# Patient Record
Sex: Female | Born: 1950 | Race: Black or African American | Hispanic: No | Smoking: Never smoker
Health system: Southern US, Community
[De-identification: ages and names within clinical notes are randomized; demographics above are authoritative.]

## PROBLEM LIST (undated history)

## (undated) DIAGNOSIS — E785 Hyperlipidemia, unspecified: Secondary | ICD-10-CM

## (undated) DIAGNOSIS — E119 Type 2 diabetes mellitus without complications: Secondary | ICD-10-CM

## (undated) DIAGNOSIS — D649 Anemia, unspecified: Secondary | ICD-10-CM

## (undated) DIAGNOSIS — T7840XA Allergy, unspecified, initial encounter: Secondary | ICD-10-CM

## (undated) DIAGNOSIS — J45909 Unspecified asthma, uncomplicated: Secondary | ICD-10-CM

## (undated) DIAGNOSIS — I10 Essential (primary) hypertension: Secondary | ICD-10-CM

## (undated) DIAGNOSIS — C50919 Malignant neoplasm of unspecified site of unspecified female breast: Secondary | ICD-10-CM

## (undated) DIAGNOSIS — C801 Malignant (primary) neoplasm, unspecified: Secondary | ICD-10-CM

## (undated) HISTORY — DX: Hyperlipidemia, unspecified: E78.5

## (undated) HISTORY — DX: Anemia, unspecified: D64.9

## (undated) HISTORY — PX: MASTECTOMY: SHX3

## (undated) HISTORY — DX: Essential (primary) hypertension: I10

## (undated) HISTORY — DX: Type 2 diabetes mellitus without complications: E11.9

## (undated) HISTORY — PX: BREAST SURGERY: SHX581

## (undated) HISTORY — DX: Malignant (primary) neoplasm, unspecified: C80.1

## (undated) HISTORY — DX: Allergy, unspecified, initial encounter: T78.40XA

## (undated) HISTORY — DX: Unspecified asthma, uncomplicated: J45.909

## (undated) HISTORY — PX: ABDOMINAL HYSTERECTOMY: SHX81

---

## 1994-07-29 DIAGNOSIS — C50919 Malignant neoplasm of unspecified site of unspecified female breast: Secondary | ICD-10-CM

## 1994-07-29 HISTORY — DX: Malignant neoplasm of unspecified site of unspecified female breast: C50.919

## 1998-06-21 ENCOUNTER — Ambulatory Visit: Admission: RE | Admit: 1998-06-21 | Discharge: 1998-06-21 | Payer: Self-pay | Admitting: Obstetrics & Gynecology

## 1998-07-26 ENCOUNTER — Inpatient Hospital Stay (HOSPITAL_COMMUNITY): Admission: AD | Admit: 1998-07-26 | Discharge: 1998-07-26 | Payer: Self-pay | Admitting: Obstetrics and Gynecology

## 1998-08-21 ENCOUNTER — Inpatient Hospital Stay (HOSPITAL_COMMUNITY): Admission: AD | Admit: 1998-08-21 | Discharge: 1998-08-24 | Payer: Self-pay | Admitting: Obstetrics & Gynecology

## 1998-10-03 ENCOUNTER — Ambulatory Visit (HOSPITAL_COMMUNITY): Admission: RE | Admit: 1998-10-03 | Discharge: 1998-10-03 | Payer: Self-pay | Admitting: Family Medicine

## 1998-10-03 ENCOUNTER — Encounter: Payer: Self-pay | Admitting: Family Medicine

## 2002-10-07 ENCOUNTER — Encounter: Admission: RE | Admit: 2002-10-07 | Discharge: 2002-10-07 | Payer: Self-pay | Admitting: Family Medicine

## 2002-10-07 ENCOUNTER — Encounter: Payer: Self-pay | Admitting: Family Medicine

## 2004-04-04 ENCOUNTER — Encounter: Admission: RE | Admit: 2004-04-04 | Discharge: 2004-04-04 | Payer: Self-pay | Admitting: Family Medicine

## 2004-05-21 ENCOUNTER — Ambulatory Visit (HOSPITAL_COMMUNITY): Admission: RE | Admit: 2004-05-21 | Discharge: 2004-05-21 | Payer: Self-pay | Admitting: Gastroenterology

## 2004-07-18 ENCOUNTER — Encounter: Admission: RE | Admit: 2004-07-18 | Discharge: 2004-10-16 | Payer: Self-pay | Admitting: Family Medicine

## 2004-11-14 ENCOUNTER — Encounter: Admission: RE | Admit: 2004-11-14 | Discharge: 2005-02-12 | Payer: Self-pay | Admitting: Family Medicine

## 2008-11-02 ENCOUNTER — Emergency Department (HOSPITAL_COMMUNITY): Admission: EM | Admit: 2008-11-02 | Discharge: 2008-11-02 | Payer: Self-pay | Admitting: Emergency Medicine

## 2009-01-26 ENCOUNTER — Ambulatory Visit (HOSPITAL_BASED_OUTPATIENT_CLINIC_OR_DEPARTMENT_OTHER): Admission: RE | Admit: 2009-01-26 | Discharge: 2009-01-27 | Payer: Self-pay | Admitting: Orthopedic Surgery

## 2009-08-26 ENCOUNTER — Emergency Department (HOSPITAL_COMMUNITY): Admission: EM | Admit: 2009-08-26 | Discharge: 2009-08-26 | Payer: Self-pay | Admitting: Emergency Medicine

## 2010-10-15 LAB — CBC
HCT: 36.2 % (ref 36.0–46.0)
Hemoglobin: 11.5 g/dL — ABNORMAL LOW (ref 12.0–15.0)
MCHC: 31.7 g/dL (ref 30.0–36.0)
MCV: 91 fL (ref 78.0–100.0)
Platelets: 173 10*3/uL (ref 150–400)
RBC: 3.98 MIL/uL (ref 3.87–5.11)
RDW: 15 % (ref 11.5–15.5)
WBC: 5.7 10*3/uL (ref 4.0–10.5)

## 2010-10-15 LAB — POCT I-STAT, CHEM 8
BUN: 20 mg/dL (ref 6–23)
Calcium, Ion: 1.25 mmol/L (ref 1.12–1.32)
Chloride: 108 mEq/L (ref 96–112)
Creatinine, Ser: 0.7 mg/dL (ref 0.4–1.2)
Glucose, Bld: 124 mg/dL — ABNORMAL HIGH (ref 70–99)
HCT: 38 % (ref 36.0–46.0)
Hemoglobin: 12.9 g/dL (ref 12.0–15.0)
Potassium: 3.7 mEq/L (ref 3.5–5.1)
Sodium: 141 mEq/L (ref 135–145)
TCO2: 27 mmol/L (ref 0–100)

## 2010-10-15 LAB — COMPREHENSIVE METABOLIC PANEL
ALT: 49 U/L — ABNORMAL HIGH (ref 0–35)
AST: 53 U/L — ABNORMAL HIGH (ref 0–37)
Albumin: 3.7 g/dL (ref 3.5–5.2)
Alkaline Phosphatase: 37 U/L — ABNORMAL LOW (ref 39–117)
BUN: 19 mg/dL (ref 6–23)
CO2: 25 mEq/L (ref 19–32)
Calcium: 9.3 mg/dL (ref 8.4–10.5)
Chloride: 105 mEq/L (ref 96–112)
Creatinine, Ser: 0.61 mg/dL (ref 0.4–1.2)
GFR calc Af Amer: >60 mL/min (ref >60)
GFR calc non Af Amer: >60 mL/min (ref >60)
Glucose, Bld: 126 mg/dL — ABNORMAL HIGH (ref 70–99)
Potassium: 3.6 mEq/L (ref 3.5–5.1)
Sodium: 137 mEq/L (ref 135–145)
Total Bilirubin: 0.6 mg/dL (ref 0.3–1.2)
Total Protein: 7.2 g/dL (ref 6.0–8.3)

## 2010-10-15 LAB — DIFFERENTIAL
Basophils Absolute: 0 10*3/uL (ref 0.0–0.1)
Basophils Relative: 1 % (ref 0–1)
Eosinophils Absolute: 0.1 10*3/uL (ref 0.0–0.7)
Eosinophils Relative: 1 % (ref 0–5)
Lymphocytes Relative: 27 % (ref 12–46)
Lymphs Abs: 1.5 10*3/uL (ref 0.7–4.0)
Monocytes Absolute: 0.3 10*3/uL (ref 0.1–1.0)
Monocytes Relative: 6 % (ref 3–12)
Neutro Abs: 3.7 10*3/uL (ref 1.7–7.7)
Neutrophils Relative %: 65 % (ref 43–77)

## 2010-11-04 LAB — POCT HEMOGLOBIN-HEMACUE: Hemoglobin: 12.4 g/dL (ref 12.0–15.0)

## 2010-11-04 LAB — GLUCOSE, CAPILLARY
Glucose-Capillary: 156 mg/dL — ABNORMAL HIGH (ref 70–99)
Glucose-Capillary: 86 mg/dL (ref 70–99)

## 2010-11-05 LAB — BASIC METABOLIC PANEL
BUN: 16 mg/dL (ref 6–23)
CO2: 28 mEq/L (ref 19–32)
Calcium: 9.3 mg/dL (ref 8.4–10.5)
Chloride: 107 mEq/L (ref 96–112)
Creatinine, Ser: 0.66 mg/dL (ref 0.4–1.2)
GFR calc Af Amer: >60 mL/min (ref >60)
GFR calc non Af Amer: >60 mL/min (ref >60)
Glucose, Bld: 105 mg/dL — ABNORMAL HIGH (ref 70–99)
Potassium: 3.6 mEq/L (ref 3.5–5.1)
Sodium: 140 mEq/L (ref 135–145)

## 2010-11-07 LAB — GLUCOSE, CAPILLARY: Glucose-Capillary: 146 mg/dL — ABNORMAL HIGH (ref 70–99)

## 2010-12-11 NOTE — Op Note (Signed)
NAMEANASTAZJA, ISAAC NO.:  [id number]   MEDICAL RECORD NO.:  [id number]          PATIENT TYPE:  AMB   LOCATION:  DSC                          FACILITY:  [facility]   PHYSICIAN:  Loreta Ave, M.D. DATE OF BIRTH:  1951/07/01   DATE OF PROCEDURE:  01/25/2009  DATE OF DISCHARGE:  01/26/2009                               OPERATIVE REPORT   PREOPERATIVE DIAGNOSES:  Right knee nonunion vertical fracture, patella  lateral aspect; lateral meniscus tear; chondromalacia, patella.   POSTOPERATIVE DIAGNOSIS:  Right knee nonunion vertical fracture, patella  lateral aspect; lateral meniscus tear; chondromalacia, patella with  relatively extensive grade 3 changes, patellofemoral joint.   PROCEDURES:  Right knee exam under anesthesia, arthroscopy, debridement  of the radial tearing throughout lateral meniscus; chondroplasty,  patella; open reduction and internal fixation of nonunion patellar  fracture with 2 cannulated 4.0 screws; internal lateral retinacular  release.   SURGEON:  Loreta Ave, MD   ASSISTANT:  Genene Churn. Barry Dienes, PA   ANESTHESIA:  General.   ESTIMATED BLOOD LOSS:  Minimal.   SPECIMENS:  None.   CULTURES:  None.   COMPLICATIONS:  None.   DRESSING:  Sterile compressive with knee immobilizer.   TOURNIQUET TIME:  1 hour.   PROCEDURE:  The patient brought to the operating room and placed on the  operating table in supine position.  After adequate anesthesia had been  obtained, knee examined.  Full motion including hyperextension.  Patellofemoral crepitus.  Some lateral tracking, not extreme and some  tethering, not extreme.  Tourniquet applied.  Prepped and draped in the  usual sterile fashion.  Exsanguinated with elevation and Esmarch,  tourniquet inflated to 350 mmHg.  Anteromedial, anterolateral  arthroscopic portals.  Arthroscope introduced and the knee inspected.  Some lateral tracking and tethering, not extreme but present.  The  nonunion was  obvious with mobility and involved about 25% of the  patella, lateral aspect.  Remaining patella had diffuse grade 3 changes  debrided.  Grade 2 on the trochlea debrided.  Medial meniscus, medial  compartment, lateral meniscus, and lateral compartment had some mild  degenerative changes, nothing extreme.  Cruciate ligament was intact.  Radial tearing throughout the lateral meniscus debrided back to healthy  tissue.  Once that was confirmed, instruments and fluid removed.  Lateral incision over the patella.  Skin and subcutaneous tissue  divided.  Patella and nonunion exposed.  This was curetted back to good  healthy tissue, bleeding bone on both sides.  With fluoroscopic  guidance, I then fixed the fracture with 2 guidewires, pre-drilled, and  then countersunk 4.0 screws placed from lateral to medial.  Gave  excellent capturing, fixation, and stability.  Confirmed visually as  well as fluoroscopically.  Arthroscope reintroduced, confirmed good  fixation of fracture.  She was tending to tether more once that fracture  had healed.  I cleaned out all the adhesions, synovial tissue, and the  undersurface of the lateral retinaculum, especially throughout the  midportion.  Once that was complete, I looked good tracking and I did  not feel that a further release  of the retinaculum with cautery was  indicated, as the degree of tethering was sufficiently obliterated with  internal release.  The entire knee was examined, no other findings.  Instruments and fluid removed.  Portals closed with nylon.  The fascia  overlying the fracture was then closed with Vicryl.  Skin and  subcutaneous tissue with Vicryl.  Steri-Strips applied.  Sterile  compressive dressing applied.  Tourniquet deflated and removed.  Knee  immobilizer applied.  Anesthesia reversed.  Brought to the recovery  room.  Tolerated the surgery well.  No complications.      Loreta Ave, M.D.  Electronically Signed     DFM/MEDQ   D:  01/26/2009  T:  01/27/2009  Job:  [id number]

## 2010-12-14 NOTE — Op Note (Signed)
NAME:  Mary Choi, Mary Choi NO.:  [id number]   MEDICAL RECORD NO.:  [id number]          PATIENT TYPE:  AMB   LOCATION:  ENDO                         FACILITY:  [facility]   PHYSICIAN:  Anselmo Rod, M.D.  DATE OF BIRTH:  Jun 19, 1951   DATE OF PROCEDURE:  05/21/2004  DATE OF DISCHARGE:                                 OPERATIVE REPORT   PROCEDURE:  Screening colonoscopy.   ENDOSCOPIST:  Anselmo Rod, M.D.   INSTRUMENT USED:  Olympus video colonoscope.   INDICATIONS FOR PROCEDURE:  This 60 year old African/American female with a  personal history of breast cancer diagnosed and treated in 1996, in the  [facility], undergoing a screening colonoscopy to rule out colon polyps, masses,  etc.   PRE-PROCEDURE PREPARATION:  An informed consent was procured from the  patient.  The patient was fasted for eight hours prior to the procedure and  prepped with a bottle of magnesium citrate and one gallon of GoLYTELY on the  night prior to the procedure.   PRE-PROCEDURE PHYSICAL EXAMINATION:  VITAL SIGNS:  Stable.  NECK:  Supple.  CHEST:  Clear to auscultation.  HEART:  S1, S2 regular.  ABDOMEN:  Soft, normal bowel sounds.   DESCRIPTION OF PROCEDURE:  The patient was placed in the left lateral  decubitus position and sedated with 75 mg of Demerol and 10 mg of Versed in  slow incremental doses.  Once the patient was adequately sedated, and  maintained on low-flow oxygen and continuous cardiac monitoring, the Olympus  video colonoscope was advanced from the rectum to the cecum with extreme  difficulty.  The patient had a very tortous colon.  The position was changed  from the left lateral to the supine, and the right lateral position on a  couple of occasions with gentle application of abdominal pressure.  She was  also turned in the prone position to reach the cecum.  The appendicular  orifice and the ileocecal valve were clearly visualized and photographed.  No masses, polyps,  erosions, ulcerations or diverticula were seen.  Retroflexion in the rectum revealed small internal hemorrhoids.  The patient tolerated the procedure well without immediate complications.   IMPRESSION:  1.  Very tortous colon.  No masses, polyps, or diverticula seen.  2.  Small internal hemorrhoids appreciated on retroflexion.   RECOMMENDATIONS:  1.  Repeat colonoscopy has been recommended in the next five years, unless      the patient develops any abnormal      symptoms in the interim.  2.  Continue a high-fiber diet with liberal fluid intake.  3.  Outpatient followup as the need rises in the future.       JNM/MEDQ  D:  05/21/2004  T:  05/21/2004  Job:  [id number]   cc:   Renaye Rakers, M.D.  [phone]  [place]  [place] [address]  Fax: [phone]   Ilda Mori, M.D.  [address]  [place], [place] [address]  Fax: [phone]   Leonie Man, M.D.  [address]  [address]  [place] [address]  Fax: [phone]

## 2013-01-07 ENCOUNTER — Ambulatory Visit (INDEPENDENT_AMBULATORY_CARE_PROVIDER_SITE_OTHER): Payer: BC Managed Care – PPO | Admitting: Family Medicine

## 2013-01-07 VITALS — BP 147/90 | HR 101 | Temp 98.7°F | Resp 18 | Ht 64.0 in | Wt 173.6 lb

## 2013-01-07 DIAGNOSIS — M79609 Pain in unspecified limb: Secondary | ICD-10-CM

## 2013-01-07 DIAGNOSIS — M79605 Pain in left leg: Secondary | ICD-10-CM

## 2013-01-07 DIAGNOSIS — R059 Cough, unspecified: Secondary | ICD-10-CM

## 2013-01-07 DIAGNOSIS — S139XXA Sprain of joints and ligaments of unspecified parts of neck, initial encounter: Secondary | ICD-10-CM

## 2013-01-07 DIAGNOSIS — R05 Cough: Secondary | ICD-10-CM

## 2013-01-07 DIAGNOSIS — M79604 Pain in right leg: Secondary | ICD-10-CM

## 2013-01-07 DIAGNOSIS — J069 Acute upper respiratory infection, unspecified: Secondary | ICD-10-CM

## 2013-01-07 DIAGNOSIS — E119 Type 2 diabetes mellitus without complications: Secondary | ICD-10-CM | POA: Insufficient documentation

## 2013-01-07 DIAGNOSIS — S161XXA Strain of muscle, fascia and tendon at neck level, initial encounter: Secondary | ICD-10-CM

## 2013-01-07 DIAGNOSIS — M549 Dorsalgia, unspecified: Secondary | ICD-10-CM

## 2013-01-07 MED ORDER — METHOCARBAMOL 500 MG PO TABS: 500.0000 mg | ORAL_TABLET | Freq: Four times a day (QID) | ORAL | Status: DC

## 2013-01-07 MED ORDER — HYDROCODONE-HOMATROPINE 5-1.5 MG/5ML PO SYRP: 5.0000 mL | ORAL_SOLUTION | ORAL | Status: DC | PRN

## 2013-01-07 MED ORDER — NAPROXEN 500 MG PO TABS: 500.0000 mg | ORAL_TABLET | Freq: Two times a day (BID) | ORAL | Status: DC

## 2013-01-07 MED ORDER — AMOXICILLIN 875 MG PO TABS: 875.0000 mg | ORAL_TABLET | Freq: Two times a day (BID) | ORAL | Status: DC
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
## 2013-01-07 NOTE — Patient Instructions (Signed)
Drink plenty of fluids  Take the cough syrup 1 teaspoon every 6 hours as needed for cough  Take the amoxicillin 875 mg twice daily for 7-10 days  Take the anti-inflammatory pain medicine (naproxen) and the muscle relaxant(methocarbamol) as directed for pain and muscle soreness. When you're feeling better you can stop these.

## 2013-02-18 ENCOUNTER — Ambulatory Visit (INDEPENDENT_AMBULATORY_CARE_PROVIDER_SITE_OTHER): Payer: BC Managed Care – PPO | Admitting: Emergency Medicine

## 2013-02-18 VITALS — BP 121/78 | HR 101 | Temp 98.0°F | Resp 16 | Ht 63.0 in | Wt 177.0 lb

## 2013-02-18 DIAGNOSIS — T148 Other injury of unspecified body region: Secondary | ICD-10-CM

## 2013-02-18 DIAGNOSIS — W57XXXA Bitten or stung by nonvenomous insect and other nonvenomous arthropods, initial encounter: Secondary | ICD-10-CM

## 2013-02-18 MED ORDER — METHYLPREDNISOLONE ACETATE 80 MG/ML IJ SUSP
120.0000 mg | Freq: Once | INTRAMUSCULAR | Status: AC
  Administered 2013-02-18: 120 mg via INTRAMUSCULAR

## 2013-02-18 NOTE — Patient Instructions (Addendum)
Bedbugs  Bedbugs are tiny bugs that live in and around beds. During the day, they hide in mattresses and other places near beds. They come out at night and bite people lying in bed. They need blood to live and grow. Bedbugs can be found in beds anywhere. Usually, they are found in places where many people come and go (hotels, shelters, hospitals). It does not matter whether the place is dirty or clean.  Getting bitten by bedbugs rarely causes a medical problem. The biggest problem can be getting rid of them.  This often takes the work of a pest control expert.  CAUSES  · Less use of pesticides. Bedbugs were common before the 1950s. Then, strong pesticides such as DDT nearly wiped them out. Today, these pesticides are not used because they harm the environment and can cause health problems.  · More travel. Besides mattresses, bedbugs can also live in clothing and luggage. They can come along as people travel from place to place. Bedbugs are more common in certain parts of the world. When people travel to those areas, the bugs can come home with them.  · Presence of birds and bats. Bedbugs often infest birds and bats. If you have these animals in or near your home, bedbugs may infest your house, too.  SYMPTOMS  It does not hurt to be bitten by a bedbug. You will probably not wake up when you are bitten. Bedbugs usually bite areas of the skin that are not covered. Symptoms may show when you wake up, or they may take a day or more to show up. Symptoms may include:  · Small red bumps on the skin. These might be lined up in a row or clustered in a group.  · A darker red dot in the middle of red bumps.  · Blisters on the skin. There may be swelling and very bad itching. These may be signs of an allergic reaction. This does not happen often.  DIAGNOSIS  Bedbug bites might look and feel like other types of insect bites. The bugs do not stay on the body like ticks or lice. They bite, drop off, and crawl away to hide. Your  caregiver will probably:  · Ask about your symptoms.  · Ask about your recent activities and travel.  · Check your skin for bedbug bites.  · Ask you to check at home for signs of bedbugs. You should look for:  · Spots or stains on the bed or nearby. This could be from bedbugs that were crushed or from their eggs or waste.  · Bedbugs themselves. They are reddish-brown, oval, and flat. They do not fly. They are about the size of an apple seed.  · Places to look for bedbugs include:  · Beds. Check mattresses, headboards, box springs, and bed frames.  · On drapes and curtains near the bed.  · Under carpeting in the bedroom.  · Behind electrical outlets.  · Behind any wallpaper that is peeling.  · Inside luggage.  TREATMENT  Most bedbug bites do not need treatment. They usually go away on their own in a few days. The bites are not dangerous. However, treatment may be needed if you have scratched so much that your skin has become infected. You may also need treatment if you are allergic to bedbug bites. Treatment options include:  · A drug that stops swelling and itching (corticosteroid). Usually, a cream is rubbed on the skin. If you have a bad rash, you may be   given a corticosteroid pill.  · Oral antihistamines. These are pills to help control itching.  · Antibiotic medicines. An antibiotic may be prescribed for infected skin.  HOME CARE INSTRUCTIONS   · Take any medicine prescribed by your caregiver for your bites. Follow the directions carefully.  · Consider wearing pajamas with long sleeves and pant legs.  · Your bedroom may need to be treated. A pest control expert should make sure the bedbugs are gone. You may need to throw away mattresses or luggage. Ask the pest control expert what you can do to keep the bedbugs from coming back. Common suggestions include:  · Putting a plastic cover over your mattress.  · Washing and drying your clothes and bedding in hot water and a hot dryer. The temperature should be hotter  than 120° F (48.9° C). Bedbugs are killed by high temperatures.  · Vacuuming carefully all around your bed. Vacuum in all cracks and crevices where the bugs might hide. Do this often.  · Carefully checking all used furniture, bedding, or clothes that you bring into your house.  · Eliminating bird nests and bat roosts.  · If you get bedbug bites when traveling, check all your possessions carefully before bringing them into your house. If you find any bugs on clothes or in your luggage, consider throwing those items away.  SEEK MEDICAL CARE IF:  · You have red bug bites that keep coming back.  · You have red bug bites that itch badly.  · You have bug bites that cause a skin rash.  · You have scratch marks that are red and sore.  SEEK IMMEDIATE MEDICAL CARE IF:  You have a fever.  Document Released: 08/17/2010 Document Revised: 10/07/2011 Document Reviewed: 08/17/2010  ExitCare® Patient Information ©2014 [employer].

## 2013-02-18 NOTE — Progress Notes (Signed)
[facility] [address], [place] [address] [phone]  Date:  02/18/2013   Name:  Mary Choi   DOB:  10/21/1950   MRN:  [id number]  PCP:  No primary provider on file.    Chief Complaint: blisters   History of Present Illness:  Mary Choi is a 62 y.o. very pleasant female patient who presents with the following:  Stayed in two separate motels over the weekend and has multiple vesicular lesions on her forearm, breast and back.  Seen in the er and told that she has a contact dermatitis.  No improvement with over the counter medications or other home remedies. Denies other complaint or health concern today.   Patient Active Problem List   Diagnosis Date Noted  . Diabetes 01/07/2013    Past Medical History  Diagnosis Date  . Allergy   . Cancer   . Asthma   . Diabetes mellitus without complication   . Hypertension     Past Surgical History  Procedure Laterality Date  . Breast surgery    . Cesarean section    . Abdominal hysterectomy      History  Substance Use Topics  . Smoking status: Never Smoker   . Smokeless tobacco: Not on file  . Alcohol Use: Yes    Family History  Problem Relation Age of Onset  . Diabetes Mother   . Hypertension Mother     No Known Allergies  Medication list has been reviewed and updated.  Current Outpatient Prescriptions on File Prior to Visit  Medication Sig Dispense Refill  . albuterol (PROVENTIL HFA;VENTOLIN HFA) 108 (90 BASE) MCG/ACT inhaler Inhale 2 puffs into the lungs every 6 (six) hours as needed for wheezing.      Marland Kitchen amLODipine (NORVASC) 10 MG tablet Take 10 mg by mouth daily.      . insulin glargine (LANTUS) 100 UNIT/ML injection Inject into the skin at bedtime. Take 20 units at bedtime      . losartan-hydrochlorothiazide (HYZAAR) 100-25 MG per tablet Take 1 tablet by mouth daily.      . metFORMIN (GLUCOPHAGE) 1000 MG tablet Take 1,000 mg by mouth daily with breakfast.      . rosuvastatin  (CRESTOR) 10 MG tablet Take 10 mg by mouth daily.       No current facility-administered medications on file prior to visit.    Review of Systems:  As per HPI, otherwise negative.   Physical Examination: Filed Vitals:   02/18/13 1723  BP: 121/78  Pulse: 101  Temp: 98 F (36.7 C)  Resp: 16   Filed Vitals:   02/18/13 1723  Height: 5\' 3"  (1.6 m)  Weight: 177 lb (80.287 kg)   Body mass index is 31.36 kg/(m^2). Ideal Body Weight: Weight in (lb) to have BMI = 25: 140.8   GEN: WDWN, NAD, Non-toxic, Alert & Oriented x 3 HEENT: Atraumatic, Normocephalic.  Ears and Nose: No external deformity. EXTR: No clubbing/cyanosis/edema NEURO: Normal gait.  PSYCH: Normally interactive. Conversant. Not depressed or anxious appearing.  Calm demeanor.  Multiple linear rows of vesicular lesions that are discrete and isolated on her right forearm.  Multiple discrete lesions on her right breast, back and left arm as well.  Assessment and Plan: Bedbugs Depo medrol [place]boro Continue benadryl and HC cream. Follow up as needed   Signed,  Phillips Odor, MD

## 2013-04-19 DIAGNOSIS — Z0271 Encounter for disability determination: Secondary | ICD-10-CM

## 2013-06-03 DIAGNOSIS — Z0271 Encounter for disability determination: Secondary | ICD-10-CM

## 2013-09-26 ENCOUNTER — Emergency Department (HOSPITAL_COMMUNITY)
Admission: EM | Admit: 2013-09-26 | Discharge: 2013-09-26 | Disposition: A | Discharge status: Home / Self Care | Payer: BC Managed Care – PPO | Attending: Emergency Medicine | Admitting: Emergency Medicine

## 2013-09-26 ENCOUNTER — Encounter (HOSPITAL_COMMUNITY): Payer: Self-pay | Admitting: Emergency Medicine

## 2013-09-26 ENCOUNTER — Emergency Department (HOSPITAL_COMMUNITY): Payer: BC Managed Care – PPO

## 2013-09-26 DIAGNOSIS — Z792 Long term (current) use of antibiotics: Secondary | ICD-10-CM | POA: Insufficient documentation

## 2013-09-26 DIAGNOSIS — IMO0002 Reserved for concepts with insufficient information to code with codable children: Secondary | ICD-10-CM | POA: Insufficient documentation

## 2013-09-26 DIAGNOSIS — Y9301 Activity, walking, marching and hiking: Secondary | ICD-10-CM | POA: Insufficient documentation

## 2013-09-26 DIAGNOSIS — E119 Type 2 diabetes mellitus without complications: Secondary | ICD-10-CM | POA: Insufficient documentation

## 2013-09-26 DIAGNOSIS — Z79899 Other long term (current) drug therapy: Secondary | ICD-10-CM | POA: Insufficient documentation

## 2013-09-26 DIAGNOSIS — W010XXA Fall on same level from slipping, tripping and stumbling without subsequent striking against object, initial encounter: Secondary | ICD-10-CM | POA: Insufficient documentation

## 2013-09-26 DIAGNOSIS — Y929 Unspecified place or not applicable: Secondary | ICD-10-CM | POA: Insufficient documentation

## 2013-09-26 DIAGNOSIS — I10 Essential (primary) hypertension: Secondary | ICD-10-CM | POA: Insufficient documentation

## 2013-09-26 DIAGNOSIS — Z859 Personal history of malignant neoplasm, unspecified: Secondary | ICD-10-CM | POA: Insufficient documentation

## 2013-09-26 DIAGNOSIS — J45909 Unspecified asthma, uncomplicated: Secondary | ICD-10-CM | POA: Insufficient documentation

## 2013-09-26 DIAGNOSIS — S22009A Unspecified fracture of unspecified thoracic vertebra, initial encounter for closed fracture: Secondary | ICD-10-CM | POA: Insufficient documentation

## 2013-09-26 DIAGNOSIS — Z794 Long term (current) use of insulin: Secondary | ICD-10-CM | POA: Insufficient documentation

## 2013-09-26 MED ORDER — MORPHINE SULFATE 4 MG/ML IJ SOLN
4.0000 mg | Freq: Once | INTRAMUSCULAR | Status: AC
  Administered 2013-09-26: 4 mg via INTRAMUSCULAR
  Filled 2013-09-26: qty 1

## 2013-09-26 MED ORDER — OXYCODONE-ACETAMINOPHEN 5-325 MG PO TABS: 1.0000 | ORAL_TABLET | ORAL | Status: DC | PRN

## 2013-09-26 MED ORDER — CYCLOBENZAPRINE HCL 5 MG PO TABS: 5.0000 mg | ORAL_TABLET | Freq: Two times a day (BID) | ORAL | Status: DC | PRN

## 2013-09-26 NOTE — ED Notes (Signed)
Pt provided ice chips and patient's husband provided orange juice and graham crackers as he is a diabetic .

## 2013-09-26 NOTE — ED Notes (Addendum)
Per EMS- Pt walked outstide this morning and slipped while going down first step. C/o upper thoracic back pain. In KED for immobilization. Pt is alert and oriented, moaning in pain. BP 124/90, HR 80. There was no LOC, skin is intact to back.

## 2013-09-26 NOTE — ED Provider Notes (Signed)
CSN: [id number]     Arrival date & time 09/26/13  1020 History   First MD Initiated Contact with Patient 09/26/13 1023     Chief Complaint  Patient presents with  . Fall     (Consider location/radiation/quality/duration/timing/severity/associated sxs/prior Treatment) HPI Comments: Pt states that she walked outside and slipped going down the first step. No dizziness or loc associated with fall. Pt is not on any thinners. Pt states that she is hurting in her mid back. Denies numbness, weakness or incontinence. Pt didn't ambulate after the fall. ems was called. Denies cp, sob, abdominal pain  The history is provided by the patient. No language interpreter was used.    Past Medical History  Diagnosis Date  . Allergy   . Cancer   . Asthma   . Diabetes mellitus without complication   . Hypertension    Past Surgical History  Procedure Laterality Date  . Breast surgery    . Cesarean section    . Abdominal hysterectomy     Family History  Problem Relation Age of Onset  . Diabetes Mother   . Hypertension Mother    History  Substance Use Topics  . Smoking status: Never Smoker   . Smokeless tobacco: Not on file  . Alcohol Use: Yes   OB History   Grav Para Term Preterm Abortions TAB SAB Ect Mult Living                 Review of Systems  Constitutional: Negative.   Respiratory: Negative.   Cardiovascular: Negative.       Allergies  Review of patient's allergies indicates no known allergies.  Home Medications   Current Outpatient Rx  Name  Route  Sig  Dispense  Refill  . albuterol (PROVENTIL HFA;VENTOLIN HFA) 108 (90 BASE) MCG/ACT inhaler   Inhalation   Inhale 2 puffs into the lungs every 6 (six) hours as needed for wheezing.         Marland Kitchen amLODipine (NORVASC) 10 MG tablet   Oral   Take 10 mg by mouth daily.         . cephALEXin (KEFLEX) 500 MG capsule   Oral   Take 500 mg by mouth 4 (four) times daily.         . diphenhydrAMINE (BENADRYL) 25 mg capsule  Oral   Take 25 mg by mouth every 6 (six) hours as needed for itching.         . hydrocortisone cream 0.5 %   Topical   Apply topically 2 (two) times daily.         . insulin glargine (LANTUS) 100 UNIT/ML injection   Subcutaneous   Inject into the skin at bedtime. Take 20 units at bedtime         . losartan-hydrochlorothiazide (HYZAAR) 100-25 MG per tablet   Oral   Take 1 tablet by mouth daily.         . metFORMIN (GLUCOPHAGE) 1000 MG tablet   Oral   Take 1,000 mg by mouth daily with breakfast.         . rosuvastatin (CRESTOR) 10 MG tablet   Oral   Take 10 mg by mouth daily.          BP 156/85  Pulse 87  Temp(Src) 97.6 F (36.4 C) (Oral)  Resp 22  Wt 175 lb (79.379 kg)  SpO2 100% Physical Exam  Nursing note and vitals reviewed. Constitutional: She is oriented to person, place, and time. She appears well-developed  and well-nourished.  HENT:  Head: Normocephalic and atraumatic.  Eyes: Conjunctivae and EOM are normal. Pupils are equal, round, and reactive to light.  Neck: Normal range of motion. Neck supple.  Cardiovascular: Normal rate and regular rhythm.   Pulmonary/Chest: Effort normal and breath sounds normal.  Abdominal: Soft. Bowel sounds are normal.  Musculoskeletal:       Cervical back: Normal.       Thoracic back: She exhibits bony tenderness.       Lumbar back: She exhibits bony tenderness.  Moving all extremities without any problem  Neurological: She is alert and oriented to person, place, and time. Coordination normal.  Skin: Skin is warm and dry.  Psychiatric: She has a normal mood and affect.    ED Course  Procedures (including critical care time) Labs Review Labs Reviewed - No data to display Imaging Review Dg Chest 2 View  09/26/2013   CLINICAL DATA:  Fall.  EXAM: CHEST  2 VIEW  COMPARISON:  08/26/2009  FINDINGS: Lungs are adequately inflated without consolidation or effusion. Cardiomediastinal silhouette and remainder of the exam is  unchanged.  IMPRESSION: No active cardiopulmonary disease.   Electronically Signed   By: Marin Olp M.D.   On: 09/26/2013 12:36   Dg Thoracic Spine 2 View  09/26/2013   CLINICAL DATA:  Pain post fall  EXAM: THORACIC SPINE - 2 VIEW  COMPARISON:  08/26/2009  FINDINGS: Osteopenia. Mild thoracic kyphosis. New T4 compression fracture deformity with approximately 30% loss of height anteriorly, no obvious retropulsion or posterior element involvement. Small anterior endplate spurs at several levels in the mid and lower thoracic spine. Surgical clips in the upper abdomen.  IMPRESSION: 1. T4 compression fracture deformity, new since 08/26/2009.   Electronically Signed   By: Arne Cleveland M.D.   On: 09/26/2013 12:49   Dg Lumbar Spine Complete  09/26/2013   CLINICAL DATA:  Pain post fall  EXAM: LUMBAR SPINE - COMPLETE 4+ VIEW  COMPARISON:  CT 08/26/2009  FINDINGS: There is a mild lumbar dextroscoliosis apex L2. Bilateral facet degenerative hypertrophy and spurring L4-5 and L5-S1. No acute fracture. Disc spaces maintained.  IMPRESSION: 1. Negative for fracture or other acute bone abnormality. 2. Lumbar dextroscoliosis with facet DJD L4-S1.   Electronically Signed   By: Arne Cleveland M.D.   On: 09/26/2013 12:51     EKG Interpretation None      MDM   Final diagnoses:  Compression fracture    Pt to go home with oxycodone and flexeril. Pt given neurosurgery follow up :pt is comfortable at this time and neurologically intact    Glendell Docker, NP 09/26/13 1559

## 2013-09-26 NOTE — Discharge Instructions (Signed)
Take your pain medications and follow up as dicussed. If you develop numbness or weakness then you need to be re seen.

## 2013-09-26 NOTE — ED Notes (Signed)
Patient ambulated in the hall without any difficulty. States she is sore but not dizzy.

## 2013-09-26 NOTE — Progress Notes (Signed)
CM attempted to see patient.Patient out of ED.Explained CM role to patients Husband, will re-consult.

## 2013-09-27 NOTE — Progress Notes (Signed)
CM met patient at bedside.Role of CM explained.Patient reports today's ED visit is secondary to a fall on Ice.Patient reports she does not anticipate any needs once She is discharged home.Patient resides with her husband.Patient has a PCP and CM provided education on the importance of PCP follow up.Patient reports she is  Compliant with her daily medications.No CM needs identified at this time.

## 2013-09-28 NOTE — ED Provider Notes (Signed)
Medical screening examination/treatment/procedure(s) were conducted as a shared visit with non-physician practitioner(s) and myself.  I personally evaluated the patient during the encounter.  On my exam (following provision of meds) the patient was ambulatory.  She continued to have pain, but a decreased amoung.       Carmin Muskrat, MD 09/28/13 [phone]

## 2015-01-23 ENCOUNTER — Ambulatory Visit (INDEPENDENT_AMBULATORY_CARE_PROVIDER_SITE_OTHER): Payer: Medicare Other

## 2015-01-23 ENCOUNTER — Ambulatory Visit (INDEPENDENT_AMBULATORY_CARE_PROVIDER_SITE_OTHER): Payer: Medicare Other | Admitting: Family Medicine

## 2015-01-23 VITALS — BP 142/90 | HR 83 | Temp 98.1°F | Resp 18 | Ht 63.0 in | Wt 169.4 lb

## 2015-01-23 DIAGNOSIS — M79674 Pain in right toe(s): Secondary | ICD-10-CM | POA: Diagnosis not present

## 2015-01-23 MED ORDER — MELOXICAM 7.5 MG PO TABS: 7.5000 mg | ORAL_TABLET | Freq: Every day | ORAL | Status: DC

## 2015-01-23 NOTE — Progress Notes (Signed)
64 yo woman who works at the [employer].  19 days ago, she fell and struck her right shin.  The swollen area is looking and feeling better.  Last night, she developed right third toe pain, tenderness, and swelling.  She thinks she hit it during the fall 2 and a half weeks ago.  Objective:  NAD Healing right anterior shin Mild swelling third toe with some tenderness when moving the toe at the MTP joint No ecchymosis or bony abn on foot  [facility] reading (PRIMARY) by  Dr. Joseph Art: no fracture seen  Assessment:  No fracture seen, suspect some inflammatory response to the trauma.     ICD-9-CM ICD-10-CM   1. Toe pain, right 729.5 M79.674 DG Toe 3rd Right     DG Toe 3rd Right     meloxicam (MOBIC) 7.5 MG tablet     Signed, Robyn Haber, MD

## 2015-01-25 ENCOUNTER — Emergency Department (HOSPITAL_COMMUNITY): Payer: Medicare Other

## 2015-01-25 ENCOUNTER — Inpatient Hospital Stay (HOSPITAL_COMMUNITY): Payer: Medicare Other

## 2015-01-25 ENCOUNTER — Observation Stay (HOSPITAL_COMMUNITY)
Admission: EM | Admit: 2015-01-25 | Discharge: 2015-01-27 | Disposition: A | Discharge status: Home / Self Care | Payer: Medicare Other | Attending: Orthopedic Surgery | Admitting: Orthopedic Surgery

## 2015-01-25 ENCOUNTER — Encounter (HOSPITAL_COMMUNITY): Payer: Self-pay | Admitting: Emergency Medicine

## 2015-01-25 DIAGNOSIS — E785 Hyperlipidemia, unspecified: Secondary | ICD-10-CM

## 2015-01-25 DIAGNOSIS — Z794 Long term (current) use of insulin: Secondary | ICD-10-CM | POA: Insufficient documentation

## 2015-01-25 DIAGNOSIS — E119 Type 2 diabetes mellitus without complications: Secondary | ICD-10-CM

## 2015-01-25 DIAGNOSIS — W109XXA Fall (on) (from) unspecified stairs and steps, initial encounter: Secondary | ICD-10-CM | POA: Insufficient documentation

## 2015-01-25 DIAGNOSIS — Y929 Unspecified place or not applicable: Secondary | ICD-10-CM | POA: Diagnosis not present

## 2015-01-25 DIAGNOSIS — S82899A Other fracture of unspecified lower leg, initial encounter for closed fracture: Secondary | ICD-10-CM | POA: Diagnosis present

## 2015-01-25 DIAGNOSIS — S92352A Displaced fracture of fifth metatarsal bone, left foot, initial encounter for closed fracture: Secondary | ICD-10-CM | POA: Insufficient documentation

## 2015-01-25 DIAGNOSIS — S82851A Displaced trimalleolar fracture of right lower leg, initial encounter for closed fracture: Secondary | ICD-10-CM | POA: Diagnosis not present

## 2015-01-25 DIAGNOSIS — S82853A Displaced trimalleolar fracture of unspecified lower leg, initial encounter for closed fracture: Secondary | ICD-10-CM

## 2015-01-25 DIAGNOSIS — Z79899 Other long term (current) drug therapy: Secondary | ICD-10-CM | POA: Diagnosis not present

## 2015-01-25 DIAGNOSIS — W19XXXA Unspecified fall, initial encounter: Secondary | ICD-10-CM

## 2015-01-25 DIAGNOSIS — T148XXA Other injury of unspecified body region, initial encounter: Secondary | ICD-10-CM

## 2015-01-25 DIAGNOSIS — J45909 Unspecified asthma, uncomplicated: Secondary | ICD-10-CM

## 2015-01-25 DIAGNOSIS — I10 Essential (primary) hypertension: Secondary | ICD-10-CM | POA: Diagnosis not present

## 2015-01-25 DIAGNOSIS — IMO0001 Reserved for inherently not codable concepts without codable children: Secondary | ICD-10-CM

## 2015-01-25 HISTORY — DX: Other fracture of unspecified lower leg, initial encounter for closed fracture: S82.899A

## 2015-01-25 LAB — CBC WITH DIFFERENTIAL/PLATELET
Basophils Absolute: 0 10*3/uL (ref 0.0–0.1)
Basophils Relative: 0 % (ref 0–1)
Eosinophils Absolute: 0 10*3/uL (ref 0.0–0.7)
Eosinophils Relative: 0 % (ref 0–5)
HCT: 36.9 % (ref 36.0–46.0)
Hemoglobin: 11.7 g/dL — ABNORMAL LOW (ref 12.0–15.0)
Lymphocytes Relative: 17 % (ref 12–46)
Lymphs Abs: 1.4 10*3/uL (ref 0.7–4.0)
MCH: 29.2 pg (ref 26.0–34.0)
MCHC: 31.7 g/dL (ref 30.0–36.0)
MCV: 92 fL (ref 78.0–100.0)
Monocytes Absolute: 0.4 10*3/uL (ref 0.1–1.0)
Monocytes Relative: 5 % (ref 3–12)
Neutro Abs: 6.5 10*3/uL (ref 1.7–7.7)
Neutrophils Relative %: 78 % — ABNORMAL HIGH (ref 43–77)
Platelets: 222 10*3/uL (ref 150–400)
RBC: 4.01 MIL/uL (ref 3.87–5.11)
RDW: 15.4 % (ref 11.5–15.5)
WBC: 8.4 10*3/uL (ref 4.0–10.5)

## 2015-01-25 LAB — BASIC METABOLIC PANEL
Anion gap: 9 (ref 5–15)
BUN: 15 mg/dL (ref 6–20)
CO2: 28 mmol/L (ref 22–32)
Calcium: 9.3 mg/dL (ref 8.9–10.3)
Chloride: 101 mmol/L (ref 101–111)
Creatinine, Ser: 0.89 mg/dL (ref 0.44–1.00)
GFR calc Af Amer: >60 mL/min (ref >60)
GFR calc non Af Amer: >60 mL/min (ref >60)
Glucose, Bld: 193 mg/dL — ABNORMAL HIGH (ref 65–99)
Potassium: 4.4 mmol/L (ref 3.5–5.1)
Sodium: 138 mmol/L (ref 135–145)

## 2015-01-25 LAB — CBG MONITORING, ED: Glucose-Capillary: 215 mg/dL — ABNORMAL HIGH (ref 65–99)

## 2015-01-25 MED ORDER — AMLODIPINE BESYLATE 10 MG PO TABS: 10.0000 mg | ORAL_TABLET | Freq: Every day | ORAL | Status: DC

## 2015-01-25 MED ORDER — LOSARTAN POTASSIUM 50 MG PO TABS
100.0000 mg | ORAL_TABLET | Freq: Every day | ORAL | Status: DC
  Administered 2015-01-26 – 2015-01-27 (×2): 100 mg via ORAL
  Filled 2015-01-25 (×3): qty 2

## 2015-01-25 MED ORDER — PROPOFOL 10 MG/ML IV BOLUS
1.0000 mg/kg | Freq: Once | INTRAVENOUS | Status: AC
  Administered 2015-01-25: 75.8 mg via INTRAVENOUS
  Filled 2015-01-25: qty 20

## 2015-01-25 MED ORDER — HYDROMORPHONE HCL 1 MG/ML IJ SOLN
INTRAMUSCULAR | Status: AC
  Filled 2015-01-25: qty 1

## 2015-01-25 MED ORDER — ALBUTEROL SULFATE (2.5 MG/3ML) 0.083% IN NEBU: 2.5000 mg | INHALATION_SOLUTION | Freq: Four times a day (QID) | RESPIRATORY_TRACT | Status: DC | PRN

## 2015-01-25 MED ORDER — INSULIN ASPART 100 UNIT/ML ~~LOC~~ SOLN: 0.0000 [IU] | Freq: Three times a day (TID) | SUBCUTANEOUS | Status: DC

## 2015-01-25 MED ORDER — ASPIRIN EC 325 MG PO TBEC
325.0000 mg | DELAYED_RELEASE_TABLET | Freq: Every day | ORAL | Status: DC
  Administered 2015-01-26 – 2015-01-27 (×2): 325 mg via ORAL
  Filled 2015-01-25 (×2): qty 1

## 2015-01-25 MED ORDER — SODIUM CHLORIDE 0.45 % IV SOLN
INTRAVENOUS | Status: AC
  Administered 2015-01-25 – 2015-01-26 (×2): via INTRAVENOUS

## 2015-01-25 MED ORDER — PROPOFOL 10 MG/ML IV BOLUS
1.0000 mg/kg | Freq: Once | INTRAVENOUS | Status: AC
  Administered 2015-01-25: 75.8 mg via INTRAVENOUS

## 2015-01-25 MED ORDER — DOCUSATE SODIUM 100 MG PO CAPS
100.0000 mg | ORAL_CAPSULE | Freq: Two times a day (BID) | ORAL | Status: DC
  Administered 2015-01-26 – 2015-01-27 (×3): 100 mg via ORAL
  Filled 2015-01-25 (×3): qty 1

## 2015-01-25 MED ORDER — INSULIN ASPART 100 UNIT/ML ~~LOC~~ SOLN: 0.0000 [IU] | Freq: Every day | SUBCUTANEOUS | Status: DC

## 2015-01-25 MED ORDER — AMLODIPINE BESYLATE 10 MG PO TABS
10.0000 mg | ORAL_TABLET | Freq: Every day | ORAL | Status: DC
  Administered 2015-01-26 – 2015-01-27 (×2): 10 mg via ORAL
  Filled 2015-01-25 (×2): qty 1

## 2015-01-25 MED ORDER — ACETAMINOPHEN 500 MG PO TABS: 500.0000 mg | ORAL_TABLET | Freq: Four times a day (QID) | ORAL | Status: DC | PRN

## 2015-01-25 MED ORDER — INSULIN ASPART 100 UNIT/ML ~~LOC~~ SOLN
3.0000 [IU] | Freq: Three times a day (TID) | SUBCUTANEOUS | Status: DC
  Administered 2015-01-26: 100 [IU] via SUBCUTANEOUS
  Administered 2015-01-26 (×2): 3 [IU] via SUBCUTANEOUS

## 2015-01-25 MED ORDER — ROSUVASTATIN CALCIUM 10 MG PO TABS
10.0000 mg | ORAL_TABLET | Freq: Every day | ORAL | Status: DC
  Administered 2015-01-26 – 2015-01-27 (×2): 10 mg via ORAL
  Filled 2015-01-25 (×2): qty 1

## 2015-01-25 MED ORDER — VITAMIN B-12 1000 MCG PO TABS
2000.0000 ug | ORAL_TABLET | Freq: Every day | ORAL | Status: DC
  Administered 2015-01-26 – 2015-01-27 (×2): 2000 ug via ORAL
  Filled 2015-01-25 (×2): qty 2

## 2015-01-25 MED ORDER — ALBUTEROL SULFATE HFA 108 (90 BASE) MCG/ACT IN AERS: 2.0000 | INHALATION_SPRAY | Freq: Four times a day (QID) | RESPIRATORY_TRACT | Status: DC | PRN

## 2015-01-25 MED ORDER — INSULIN GLARGINE 100 UNIT/ML ~~LOC~~ SOLN
12.0000 [IU] | Freq: Every day | SUBCUTANEOUS | Status: DC
  Administered 2015-01-26 (×2): 12 [IU] via SUBCUTANEOUS
  Filled 2015-01-25 (×3): qty 0.12

## 2015-01-25 MED ORDER — LOSARTAN POTASSIUM-HCTZ 100-25 MG PO TABS: 1.0000 | ORAL_TABLET | Freq: Every day | ORAL | Status: DC

## 2015-01-25 MED ORDER — PROPOFOL 10 MG/ML IV BOLUS
INTRAVENOUS | Status: AC
Start: 2015-01-25 — End: 2015-01-26
  Filled 2015-01-25: qty 20

## 2015-01-25 MED ORDER — HYDROMORPHONE HCL 1 MG/ML IJ SOLN
1.0000 mg | Freq: Once | INTRAMUSCULAR | Status: AC
  Administered 2015-01-25: 1 mg via INTRAVENOUS

## 2015-01-25 MED ORDER — HYDROCODONE-ACETAMINOPHEN 5-325 MG PO TABS
1.0000 | ORAL_TABLET | Freq: Four times a day (QID) | ORAL | Status: DC | PRN
  Administered 2015-01-26 – 2015-01-27 (×3): 2 via ORAL
  Filled 2015-01-25 (×3): qty 2

## 2015-01-25 MED ORDER — ONDANSETRON HCL 4 MG/2ML IJ SOLN
4.0000 mg | Freq: Once | INTRAMUSCULAR | Status: AC
  Administered 2015-01-25: 4 mg via INTRAVENOUS
  Filled 2015-01-25: qty 2

## 2015-01-25 MED ORDER — HYDROCHLOROTHIAZIDE 25 MG PO TABS
25.0000 mg | ORAL_TABLET | Freq: Every day | ORAL | Status: DC
  Administered 2015-01-26 – 2015-01-27 (×2): 25 mg via ORAL
  Filled 2015-01-25 (×2): qty 1

## 2015-01-25 MED ORDER — INSULIN ASPART 100 UNIT/ML ~~LOC~~ SOLN
0.0000 [IU] | Freq: Three times a day (TID) | SUBCUTANEOUS | Status: DC
  Administered 2015-01-26 (×2): 2 [IU] via SUBCUTANEOUS
  Administered 2015-01-27: 1 [IU] via SUBCUTANEOUS

## 2015-01-25 MED ORDER — MORPHINE SULFATE 2 MG/ML IJ SOLN: 0.5000 mg | INTRAMUSCULAR | Status: DC | PRN

## 2015-01-25 MED ORDER — CYCLOBENZAPRINE HCL 10 MG PO TABS: 5.0000 mg | ORAL_TABLET | Freq: Two times a day (BID) | ORAL | Status: DC | PRN

## 2015-01-25 NOTE — H&P (Signed)
ORTHOPAEDIC CONSULTATION  REQUESTING PHYSICIAN: Renette Butters, MD  Chief Complaint: fall down the stairs with bilateral foot/ankle injuries  HPI: Mary Choi is a 64 y.o. female who suffered a mechanical fall down a few stairs with bilateral foot and ankle pain. She had a dislocated ankle on the right side that was reduced by the emergency Department however there is still some lateral subluxation of the talus. She has gotten a boot for the left foot. She complains of some soreness in her ankle. She denies any numbness or tingling. She denies any other injury.  Past Medical History  Diagnosis Date  . Allergy   . Cancer   . Asthma   . Diabetes mellitus without complication   . Hypertension    Past Surgical History  Procedure Laterality Date  . Breast surgery    . Cesarean section    . Abdominal hysterectomy     History   Social History  . Marital Status: Married    Spouse Name: N/A  . Number of Children: N/A  . Years of Education: N/A   Social History Main Topics  . Smoking status: Never Smoker   . Smokeless tobacco: Not on file  . Alcohol Use: Yes  . Drug Use: No  . Sexual Activity: Not on file   Other Topics Concern  . None   Social History Narrative   Family History  Problem Relation Age of Onset  . Diabetes Mother   . Hypertension Mother    No Known Allergies Prior to Admission medications   Medication Sig Start Date End Date Taking? Authorizing Provider  acetaminophen (TYLENOL) 500 MG tablet Take 500 mg by mouth every 6 (six) hours as needed for mild pain.   Yes Historical Provider, MD  albuterol (PROVENTIL HFA;VENTOLIN HFA) 108 (90 BASE) MCG/ACT inhaler Inhale 2 puffs into the lungs every 6 (six) hours as needed for wheezing.   Yes Historical Provider, MD  amLODipine (NORVASC) 10 MG tablet Take 10 mg by mouth daily.   Yes Historical Provider, MD  cyanocobalamin 2000 MCG tablet Take 2,000 mcg by mouth daily.   Yes Historical Provider, MD    Insulin Glargine (TOUJEO SOLOSTAR) 300 UNIT/ML SOPN Inject 23 Units into the skin at bedtime.   Yes Historical Provider, MD  losartan-hydrochlorothiazide (HYZAAR) 100-25 MG per tablet Take 1 tablet by mouth daily.   Yes Historical Provider, MD  meloxicam (MOBIC) 7.5 MG tablet Take 1 tablet (7.5 mg total) by mouth daily. 01/23/15  Yes Robyn Haber, MD  metFORMIN (GLUCOPHAGE) 1000 MG tablet Take 1,000 mg by mouth at bedtime.    Yes Historical Provider, MD  pioglitazone (ACTOS) 15 MG tablet Take 15 mg by mouth at bedtime.   Yes Historical Provider, MD  rosuvastatin (CRESTOR) 10 MG tablet Take 10 mg by mouth daily.   Yes Historical Provider, MD  cyclobenzaprine (FLEXERIL) 5 MG tablet Take 1 tablet (5 mg total) by mouth 2 (two) times daily as needed for muscle spasms. Patient not taking: Reported on 01/25/2015 09/26/13   Glendell Docker, NP   Dg Tibia/fibula Right  01/25/2015   CLINICAL DATA:  Right ankle pain, deformity, fall down steps today  EXAM: RIGHT TIBIA AND FIBULA - 2 VIEW  COMPARISON:  Right ankle same day  FINDINGS: Four views of the right tibia-fibula submitted. There is displaced fracture in the distal right tibia and fibula with disruption of ankle mortise and dislocation of the ankle. Displaced fracture of medial tibial malleolus. There is  medial displacement of distal tibia. Anteromedial displacement of distal shaft of right fibula. Plantar and posterior spurring of calcaneus. No proximal fibular or tibial fracture. Two metallic screws are noted in right patella.  IMPRESSION: Displaced fracture of distal right tibia and fibula with ankle dislocation and medial displacement of distal tibia. Anteromedial displacement of distal shaft of right fibula.   Electronically Signed   By: Lahoma Crocker M.D.   On: 01/25/2015 15:43   Dg Ankle 2 Views Right  01/25/2015   CLINICAL DATA:  RIGHT ankle pain and deformity. Fall down steps today. Initial encounter.  EXAM: RIGHT ANKLE - 2 VIEW  COMPARISON:  None.   FINDINGS: Fracture dislocation of the RIGHT ankle is present. The talus is laterally dislocated. Displaced medial malleolus fracture. Distal fibular metaphysis fracture. Talus is also posteriorly disc located relative to the tibial plafond. The fracture appears to be a try malleolar fracture. Calcaneal spurs are incidentally noted. Apex medial angulation of the fracture components with valgus deformity of the ankle.  IMPRESSION: Trimalleolar RIGHT ankle fracture dislocation.   Electronically Signed   By: Dereck Ligas M.D.   On: 01/25/2015 15:39   Dg Ankle Complete Left  01/25/2015   CLINICAL DATA:  LEFT ankle pain and deformity. Fall down steps today. Initial encounter.  EXAM: LEFT ANKLE COMPLETE - 3+ VIEW; LEFT FOOT - COMPLETE 3+ VIEW  COMPARISON:  None.  FINDINGS: LEFT ankle: The ankle mortise is congruent. The talar dome appears intact. Tibia and fibula appear normal. On the lateral projection, there is a fifth metatarsal base fracture, probably representing a pseudo Jones fracture.  LEFT foot: The alignment of the LEFT foot is within normal limits. Pseudo Jones fracture of the LEFT fifth metatarsal base is present. Fracture is minimally displaced, with distraction of about 2 mm. The fracture extends intra-articular at the fifth tarsometatarsal junction. No other fractures are identified.  IMPRESSION: 1. Intact LEFT ankle. 2. Pseudo Jones fracture of the fifth metatarsal base.   Electronically Signed   By: Dereck Ligas M.D.   On: 01/25/2015 15:43   Dg Ankle Right Port  01/25/2015   CLINICAL DATA:  Postreduction trimalleolar right ankle fracture.  EXAM: PORTABLE RIGHT ANKLE - 2 VIEW  COMPARISON:  Radiographs same date.  FINDINGS: 1645 hours. The ankle has been splinted. There is improved alignment of the trimalleolar fracture with mild residual posterior subluxation of the talar dome with respect to the tibial plafond.  IMPRESSION: Improved alignment of the trimalleolar fracture with mild residual  posterior subluxation of the talus.   Electronically Signed   By: Richardean Sale M.D.   On: 01/25/2015 17:00   Dg Foot Complete Left  01/25/2015   CLINICAL DATA:  LEFT ankle pain and deformity. Fall down steps today. Initial encounter.  EXAM: LEFT ANKLE COMPLETE - 3+ VIEW; LEFT FOOT - COMPLETE 3+ VIEW  COMPARISON:  None.  FINDINGS: LEFT ankle: The ankle mortise is congruent. The talar dome appears intact. Tibia and fibula appear normal. On the lateral projection, there is a fifth metatarsal base fracture, probably representing a pseudo Jones fracture.  LEFT foot: The alignment of the LEFT foot is within normal limits. Pseudo Jones fracture of the LEFT fifth metatarsal base is present. Fracture is minimally displaced, with distraction of about 2 mm. The fracture extends intra-articular at the fifth tarsometatarsal junction. No other fractures are identified.  IMPRESSION: 1. Intact LEFT ankle. 2. Pseudo Jones fracture of the fifth metatarsal base.   Electronically Signed   By: Cay Schillings  Lamke M.D.   On: 01/25/2015 15:43    Positive ROS: All other systems have been reviewed and were otherwise negative with the exception of those mentioned in the HPI and as above.  Labs cbc  Recent Labs  01/25/15 1548  WBC 8.4  HGB 11.7*  HCT 36.9  PLT 222    Labs inflam No results for input(s): CRP in the last 72 hours.  Invalid input(s): ESR  Labs coag No results for input(s): INR, PTT in the last 72 hours.  Invalid input(s): PT   Recent Labs  01/25/15 1548  NA 138  K 4.4  CL 101  CO2 28  GLUCOSE 193*  BUN 15  CREATININE 0.89  CALCIUM 9.3    Physical Exam: Filed Vitals:   01/25/15 1800  BP: 123/86  Pulse: 67  Temp:   Resp: 10   General: Alert, no acute distress Cardiovascular: No pedal edema Respiratory: No cyanosis, no use of accessory musculature GI: No organomegaly, abdomen is soft and non-tender Skin: No lesions in the area of chief complaint other than those listed below in  MSK exam.  Neurologic: Sensation intact distally Psychiatric: Patient is competent for consent with normal mood and affect Lymphatic: No axillary or cervical lymphadenopathy  MUSCULOSKELETAL:  Upper extremities are atraumatic.  The right lower extremity skin is intact she has ecchymosis around the leg she is neurovascularly intact to the toes. Compartments are soft.  Left lower extremity skin is intact neurovascularly intact compartments soft. Tenderness at her fifth metatarsal base. Other extremities are atraumatic with painless ROM and NVI.  Assessment: Right Trimal fracture Left 5MT avulsion fx  Plan: I will admit the patient tonight. If she clears PT to go home with may do that over the next day or 2 she will need to elevate that right lower extremity Plan for surgery next Tuesday. Weight Bearing Status: NWB RLE, WBAT LLE  PT VTE px: SCD's and ASA 325 (hold on Tuesday morning if she is still here  [facility] consult for risk stratification and management of blood sugars assistance.   Procedure: After appropriate timeout was sedation provided by the ED staff I performed a closed reduction of her right ankle with a short leg splint fluoroscopy shots showed appropriate reduction. She tolerated this well and was able to wiggle her toes afterwards.   Renette Butters, MD Cell [phone]   01/25/2015 6:30 PM

## 2015-01-25 NOTE — ED Notes (Signed)
Bed placement notified of dirty room on 5N. She states that she will work on getting the pt placed into a clean room.

## 2015-01-25 NOTE — ED Notes (Signed)
Per EMS: pt from home with trip and fall down stairs; pt with obvious right foot deformity; CMS intact; no other complaint; pt denies LOC; pt given 350 mcg fentanyl: IV 22g L hand

## 2015-01-25 NOTE — Progress Notes (Signed)
Orthopedic Tech Progress Note Patient Details:  LIBERTY SETO [birth year] [id number] Assisted with fx reduction of Rt. Ankle.  Applied fiberglass short leg splint and fiberglass stirrup splint to RLE post reduction.  Pulses, sensation, motion intact before and after splinting.  Capillary refill less than 2 seconds before and after splinting.  Applied CAM walker to LLE.   Ortho Devices Type of Ortho Device: Stirrup splint, Post (short leg) splint, CAM walker Ortho Device/Splint Location: RLE, LLE Ortho Device/Splint Interventions: Application   Darrol Poke 01/25/2015, 5:05 PM

## 2015-01-25 NOTE — ED Notes (Signed)
Liliane Channel (pt brother) [phone] to pick pt up at discharge

## 2015-01-25 NOTE — ED Notes (Signed)
Patient transported to X-ray 

## 2015-01-25 NOTE — ED Notes (Addendum)
Attempted report. Accepting nurse states that the room is dirty. This RN to call bed placement.

## 2015-01-25 NOTE — ED Notes (Signed)
DINNER TRAY ORDER @1948 /CARB MODIFIED.

## 2015-01-25 NOTE — ED Provider Notes (Signed)
Procedure: Conscious Sedation Patient was placed on suplemental oxygen as well as continuous cardiac and pulse oximetry monitoring. Patient was administered medications under direct supervision by myself. Excellent sedation was achieved. Patient's vital signs remained stable. The patient was continuously monitored during the recovery phase. The patient tolerated the sedation without complication. Total length of sedation 15 minutes.  Orpah Greek, MD 01/25/15 [phone]

## 2015-01-25 NOTE — ED Provider Notes (Signed)
CSN: [id number]     Arrival date & time 01/25/15  1358 History   First MD Initiated Contact with Patient 01/25/15 1359     Chief Complaint  Patient presents with  . Fall  . Ankle Pain     (Consider location/radiation/quality/duration/timing/severity/associated sxs/prior Treatment) Patient is a 64 y.o. female presenting with fall and ankle pain.  Fall This is a new problem. The current episode started 1 to 2 hours ago. The problem occurs constantly.  Ankle Pain Location:  Ankle Time since incident:  1 hour Injury: yes   Mechanism of injury: fall   Fall:    Fall occurred:  Down stairs   Height of fall:  5 stairs Ankle location:  L ankle and R ankle Pain details:    Quality:  Shooting and sharp   Radiates to:  Does not radiate   Severity:  Severe   Onset quality:  Sudden   Duration:  1 hour   Timing:  Constant   Progression:  Unchanged Chronicity:  New Foreign body present:  No foreign bodies Prior injury to area:  Yes Relieved by:  Nothing Worsened by:  Abduction, bearing weight, flexion, adduction and extension Ineffective treatments:  None tried Associated symptoms: decreased ROM, swelling and tingling   Associated symptoms: no back pain, no fever and no numbness     Past Medical History  Diagnosis Date  . Allergy   . Cancer   . Asthma   . Diabetes mellitus without complication   . Hypertension    Past Surgical History  Procedure Laterality Date  . Breast surgery    . Cesarean section    . Abdominal hysterectomy     Family History  Problem Relation Age of Onset  . Diabetes Mother   . Hypertension Mother    History  Substance Use Topics  . Smoking status: Never Smoker   . Smokeless tobacco: Not on file  . Alcohol Use: Yes   OB History    No data available     Review of Systems  Constitutional: Negative for fever.  Musculoskeletal: Negative for back pain.  All other systems reviewed and are negative.     Allergies  Review of patient's  allergies indicates no known allergies.  Home Medications   Prior to Admission medications   Medication Sig Start Date End Date Taking? Authorizing Provider  acetaminophen (TYLENOL) 500 MG tablet Take 500 mg by mouth every 6 (six) hours as needed for mild pain.   Yes Historical Provider, MD  albuterol (PROVENTIL HFA;VENTOLIN HFA) 108 (90 BASE) MCG/ACT inhaler Inhale 2 puffs into the lungs every 6 (six) hours as needed for wheezing.   Yes Historical Provider, MD  amLODipine (NORVASC) 10 MG tablet Take 10 mg by mouth daily.   Yes Historical Provider, MD  cyanocobalamin 2000 MCG tablet Take 2,000 mcg by mouth daily.   Yes Historical Provider, MD  Insulin Glargine (TOUJEO SOLOSTAR) 300 UNIT/ML SOPN Inject 23 Units into the skin at bedtime.   Yes Historical Provider, MD  losartan-hydrochlorothiazide (HYZAAR) 100-25 MG per tablet Take 1 tablet by mouth daily.   Yes Historical Provider, MD  meloxicam (MOBIC) 7.5 MG tablet Take 1 tablet (7.5 mg total) by mouth daily. 01/23/15  Yes Robyn Haber, MD  metFORMIN (GLUCOPHAGE) 1000 MG tablet Take 1,000 mg by mouth at bedtime.    Yes Historical Provider, MD  pioglitazone (ACTOS) 15 MG tablet Take 15 mg by mouth at bedtime.   Yes Historical Provider, MD  rosuvastatin (CRESTOR) 10  MG tablet Take 10 mg by mouth daily.   Yes Historical Provider, MD  aspirin 325 MG tablet Take 1 tablet (325 mg total) by mouth daily. 01/26/15   Brittney Claiborne Billings, PA-C  cyclobenzaprine (FLEXERIL) 5 MG tablet Take 1 tablet (5 mg total) by mouth 2 (two) times daily as needed for muscle spasms. Patient not taking: Reported on 01/25/2015 09/26/13   Glendell Docker, NP  docusate sodium (COLACE) 100 MG capsule Take 1 capsule (100 mg total) by mouth 2 (two) times daily. 01/26/15   Brittney Claiborne Billings, PA-C  ondansetron (ZOFRAN) 4 MG tablet Take 1 tablet (4 mg total) by mouth every 8 (eight) hours as needed for nausea or vomiting. 01/26/15   Lovett Calender, PA-C  oxyCODONE-acetaminophen (PERCOCET)  5-325 MG per tablet Take 1-2 tablets by mouth every 4 (four) hours as needed for severe pain. 01/26/15   Brittney Kelly, PA-C   BP 107/68 mmHg  Pulse 73  Temp(Src) 98.2 F (36.8 C) (Oral)  Resp 15  Wt 167 lb (75.751 kg)  SpO2 99% Physical Exam  Constitutional: She is oriented to person, place, and time. She appears well-developed and well-nourished.  HENT:  Head: Normocephalic and atraumatic.  Right Ear: External ear normal.  Left Ear: External ear normal.  Eyes: Conjunctivae and EOM are normal. Pupils are equal, round, and reactive to light.  Neck: Normal range of motion. Neck supple.  Cardiovascular: Normal rate, regular rhythm, normal heart sounds and intact distal pulses.   Pulmonary/Chest: Effort normal and breath sounds normal.  Abdominal: Soft. Bowel sounds are normal. There is no tenderness.  Musculoskeletal:       Right ankle: She exhibits decreased range of motion (2/2 pain) and deformity (lateral displacement of foot, pulses 2+).  Neurological: She is alert and oriented to person, place, and time.  Skin: Skin is warm and dry.  Vitals reviewed.   ED Course  Reduction of dislocation Date/Time: 01/25/2015 4:38 PM Performed by: Debby Freiberg Authorized by: Debby Freiberg Consent: Verbal consent obtained. Written consent obtained. Time out: Immediately prior to procedure a "time out" was called to verify the correct patient, procedure, equipment, support staff and site/side marked as required. Preparation: Patient was prepped and draped in the usual sterile fashion. Patient sedated: yes Sedation type: moderate (conscious) sedation Patient tolerance: Patient tolerated the procedure well with no immediate complications   (including critical care time) Labs Review Labs Reviewed  CBC WITH DIFFERENTIAL/PLATELET - Abnormal; Notable for the following:    Hemoglobin 11.7 (*)    Neutrophils Relative % 78 (*)    All other components within normal limits  BASIC METABOLIC  PANEL - Abnormal; Notable for the following:    Glucose, Bld 193 (*)    All other components within normal limits  GLUCOSE, CAPILLARY - Abnormal; Notable for the following:    Glucose-Capillary 157 (*)    All other components within normal limits  GLUCOSE, CAPILLARY - Abnormal; Notable for the following:    Glucose-Capillary 101 (*)    All other components within normal limits  GLUCOSE, CAPILLARY - Abnormal; Notable for the following:    Glucose-Capillary 154 (*)    All other components within normal limits  CBG MONITORING, ED - Abnormal; Notable for the following:    Glucose-Capillary 215 (*)    All other components within normal limits  HEMOGLOBIN A1C    Imaging Review Dg Tibia/fibula Right  01/25/2015   CLINICAL DATA:  Right ankle pain, deformity, fall down steps today  EXAM: RIGHT TIBIA AND FIBULA -  2 VIEW  COMPARISON:  Right ankle same day  FINDINGS: Four views of the right tibia-fibula submitted. There is displaced fracture in the distal right tibia and fibula with disruption of ankle mortise and dislocation of the ankle. Displaced fracture of medial tibial malleolus. There is medial displacement of distal tibia. Anteromedial displacement of distal shaft of right fibula. Plantar and posterior spurring of calcaneus. No proximal fibular or tibial fracture. Two metallic screws are noted in right patella.  IMPRESSION: Displaced fracture of distal right tibia and fibula with ankle dislocation and medial displacement of distal tibia. Anteromedial displacement of distal shaft of right fibula.   Electronically Signed   By: Lahoma Crocker M.D.   On: 01/25/2015 15:43   Dg Ankle 2 Views Right  01/25/2015   CLINICAL DATA:  RIGHT ankle pain and deformity. Fall down steps today. Initial encounter.  EXAM: RIGHT ANKLE - 2 VIEW  COMPARISON:  None.  FINDINGS: Fracture dislocation of the RIGHT ankle is present. The talus is laterally dislocated. Displaced medial malleolus fracture. Distal fibular metaphysis  fracture. Talus is also posteriorly disc located relative to the tibial plafond. The fracture appears to be a try malleolar fracture. Calcaneal spurs are incidentally noted. Apex medial angulation of the fracture components with valgus deformity of the ankle.  IMPRESSION: Trimalleolar RIGHT ankle fracture dislocation.   Electronically Signed   By: Dereck Ligas M.D.   On: 01/25/2015 15:39   Dg Ankle Complete Left  01/25/2015   CLINICAL DATA:  LEFT ankle pain and deformity. Fall down steps today. Initial encounter.  EXAM: LEFT ANKLE COMPLETE - 3+ VIEW; LEFT FOOT - COMPLETE 3+ VIEW  COMPARISON:  None.  FINDINGS: LEFT ankle: The ankle mortise is congruent. The talar dome appears intact. Tibia and fibula appear normal. On the lateral projection, there is a fifth metatarsal base fracture, probably representing a pseudo Jones fracture.  LEFT foot: The alignment of the LEFT foot is within normal limits. Pseudo Jones fracture of the LEFT fifth metatarsal base is present. Fracture is minimally displaced, with distraction of about 2 mm. The fracture extends intra-articular at the fifth tarsometatarsal junction. No other fractures are identified.  IMPRESSION: 1. Intact LEFT ankle. 2. Pseudo Jones fracture of the fifth metatarsal base.   Electronically Signed   By: Dereck Ligas M.D.   On: 01/25/2015 15:43   Dg Ankle Complete Right  01/25/2015   CLINICAL DATA:  64 year old female with distal tibial and fibular fracture status post cast placement. Follow up study.  EXAM: RIGHT ANKLE - COMPLETE 3+ VIEW  COMPARISON:  Radiograph dated 01/25/2015.  FINDINGS: Evaluation of the study is limited due to overlying cast.  Oblique fracture of the distal fibula with approximately 4 mm lateral displacement of the distal fracture fragment similar to prior study. There is nondisplaced fracture of the medial malleolus. There is anatomic alignment of the talotibial articulation.  IMPRESSION: Fractures of the distal fibula and medial  malleolus.  Anatomic alignment of the talotibial articulation.   Electronically Signed   By: Anner Crete M.D.   On: 01/25/2015 19:07   Dg Ankle Right Port  01/25/2015   CLINICAL DATA:  Postreduction trimalleolar right ankle fracture.  EXAM: PORTABLE RIGHT ANKLE - 2 VIEW  COMPARISON:  Radiographs same date.  FINDINGS: 1645 hours. The ankle has been splinted. There is improved alignment of the trimalleolar fracture with mild residual posterior subluxation of the talar dome with respect to the tibial plafond.  IMPRESSION: Improved alignment of the trimalleolar fracture with  mild residual posterior subluxation of the talus.   Electronically Signed   By: Richardean Sale M.D.   On: 01/25/2015 17:00   Dg Foot Complete Left  01/25/2015   CLINICAL DATA:  LEFT ankle pain and deformity. Fall down steps today. Initial encounter.  EXAM: LEFT ANKLE COMPLETE - 3+ VIEW; LEFT FOOT - COMPLETE 3+ VIEW  COMPARISON:  None.  FINDINGS: LEFT ankle: The ankle mortise is congruent. The talar dome appears intact. Tibia and fibula appear normal. On the lateral projection, there is a fifth metatarsal base fracture, probably representing a pseudo Jones fracture.  LEFT foot: The alignment of the LEFT foot is within normal limits. Pseudo Jones fracture of the LEFT fifth metatarsal base is present. Fracture is minimally displaced, with distraction of about 2 mm. The fracture extends intra-articular at the fifth tarsometatarsal junction. No other fractures are identified.  IMPRESSION: 1. Intact LEFT ankle. 2. Pseudo Jones fracture of the fifth metatarsal base.   Electronically Signed   By: Dereck Ligas M.D.   On: 01/25/2015 15:43     EKG Interpretation   Date/Time:  Wednesday January 25 2015 18:32:30 EDT Ventricular Rate:  87 PR Interval:  172 QRS Duration: 106 QT Interval:  396 QTC Calculation: 476 R Axis:     Text Interpretation:  Normal sinus rhythm Moderate voltage criteria for  LVH, may be normal variant Cannot rule out  Septal infarct , age  undetermined Abnormal ECG No previous tracing Confirmed by POLLINA  MD,  CHRISTOPHER [phone]) on 01/25/2015 6:38:56 PM      MDM   Final diagnoses:  Fall  Trimalleolar fracture    64 y.o. female with pertinent PMH of DM, HTN presents with R ankle pain after mechanical fall with eversion injury of ankle.  Physical exam as as above with obvious deformity, intact pulses.   Wu with trimal fx of R ankle, L with 5th metatarsal fx.  Spoke with orthopedics who recommended reduction, NWB on R, wbat on L.  Reduced uneventfully as above initially, spoke with orthopedics again who would like to reduce to more anatomic position.  Pt also unable to ambulate on her own. Pt care to Dr. Betsey Holiday pending final dispo.    I have reviewed all laboratory and imaging studies if ordered as above  1. Fall, initial encounter   2. Fall   3. Trimalleolar fracture   4. Fracture         Debby Freiberg, MD 01/26/15 [phone]

## 2015-01-25 NOTE — Consult Note (Signed)
Date: 01/25/2015               Patient Name:  Mary Choi MRN: [id number]  DOB: 09/24/1950 Age / Sex: 64 y.o., female   PCP: No Pcp Per Patient         Requesting Physician: Dr. Renette Butters, MD    Consulting Reason:  Pre op clearance     Chief Complaint: fall and ankle pain  History of Present Illness:   64 yo female with hx of DM II, HLD, and HTN presented to the ED with fall and ankle pain. She fell down about 5 steps. No LOC, just lost balance and fell. Imaging showed trimalleolar right ankle fracture dislocation, pseudo jones fracture of left 5th metatarsal base. ED consulted ortho Dr. Percell Miller who admitted the patient on his service and we were consulted for her medical management and pre-op clearance.  Patient has pain on both ankles, denies any other pain. No cp/sob/n/v/diarrhea. Has hx of asthma but very well controlled. No cardiac hx, no hx of CHF. Diabetes has been well controlled per patient, mostly <200 with her current regimen. Doesn't know what her hgba1c was last. HTN also well controlled on her current regimen per patient. currently has splint on right leg and CAM walker on LLE placed by ortho tech, had good pulses and brisk cap refill before the placement.    Meds: Current Facility-Administered Medications  Medication Dose Route Frequency Provider Last Rate Last Dose  . 0.45 % sodium chloride infusion   Intravenous Continuous Renette Butters, MD      . acetaminophen (TYLENOL) tablet 500 mg  500 mg Oral Q6H PRN Tecora Eustache, MD      . albuterol (PROVENTIL HFA;VENTOLIN HFA) 108 (90 BASE) MCG/ACT inhaler 2 puff  2 puff Inhalation Q6H PRN Genevia Bouldin, MD      . amLODipine (NORVASC) tablet 10 mg  10 mg Oral Daily Jennye Runquist, MD      . aspirin EC tablet 325 mg  325 mg Oral Daily Renette Butters, MD      . cyclobenzaprine (FLEXERIL) tablet 5 mg  5 mg Oral BID PRN Kupono Marling, MD      . docusate sodium (COLACE) capsule 100 mg  100 mg Oral BID Renette Butters, MD        . HYDROcodone-acetaminophen (NORCO/VICODIN) 5-325 MG per tablet 1-2 tablet  1-2 tablet Oral Q6H PRN Renette Butters, MD      . insulin aspart (novoLOG) injection 0-5 Units  0-5 Units Subcutaneous QHS Renette Butters, MD      . Derrill Memo ON 01/26/2015] insulin aspart (novoLOG) injection 0-9 Units  0-9 Units Subcutaneous TID WC Renette Butters, MD      . Derrill Memo ON 01/26/2015] insulin aspart (novoLOG) injection 0-9 Units  0-9 Units Subcutaneous TID WC Crystalina Stodghill, MD      . Derrill Memo ON 01/26/2015] insulin aspart (novoLOG) injection 3 Units  3 Units Subcutaneous TID WC Jarren Para, MD      . insulin glargine (LANTUS) injection 12 Units  12 Units Subcutaneous QHS Keyion Knack, MD      . losartan-hydrochlorothiazide (HYZAAR) 100-25 MG per tablet 1 tablet  1 tablet Oral Daily Delcenia Inman, MD      . morphine 2 MG/ML injection 0.5 mg  0.5 mg Intravenous Q2H PRN Renette Butters, MD      . propofol (DIPRIVAN) 10 mg/mL bolus/IV push           .  rosuvastatin (CRESTOR) tablet 10 mg  10 mg Oral Daily Carmel Waddington, MD      . vitamin B-12 (CYANOCOBALAMIN) tablet 2,000 mcg  2,000 mcg Oral Daily Maryssa Giampietro, MD       Current Outpatient Prescriptions  Medication Sig Dispense Refill  . acetaminophen (TYLENOL) 500 MG tablet Take 500 mg by mouth every 6 (six) hours as needed for mild pain.    Marland Kitchen albuterol (PROVENTIL HFA;VENTOLIN HFA) 108 (90 BASE) MCG/ACT inhaler Inhale 2 puffs into the lungs every 6 (six) hours as needed for wheezing.    Marland Kitchen amLODipine (NORVASC) 10 MG tablet Take 10 mg by mouth daily.    . cyanocobalamin 2000 MCG tablet Take 2,000 mcg by mouth daily.    . Insulin Glargine (TOUJEO SOLOSTAR) 300 UNIT/ML SOPN Inject 23 Units into the skin at bedtime.    Marland Kitchen losartan-hydrochlorothiazide (HYZAAR) 100-25 MG per tablet Take 1 tablet by mouth daily.    . meloxicam (MOBIC) 7.5 MG tablet Take 1 tablet (7.5 mg total) by mouth daily. 10 tablet 1  . metFORMIN (GLUCOPHAGE) 1000 MG tablet Take 1,000 mg by mouth at  bedtime.     . pioglitazone (ACTOS) 15 MG tablet Take 15 mg by mouth at bedtime.    . rosuvastatin (CRESTOR) 10 MG tablet Take 10 mg by mouth daily.    . cyclobenzaprine (FLEXERIL) 5 MG tablet Take 1 tablet (5 mg total) by mouth 2 (two) times daily as needed for muscle spasms. (Patient not taking: Reported on 01/25/2015) 20 tablet 0    Allergies: Allergies as of 01/25/2015  . (No Known Allergies)   Past Medical History  Diagnosis Date  . Allergy   . Cancer   . Asthma   . Diabetes mellitus without complication   . Hypertension    Past Surgical History  Procedure Laterality Date  . Breast surgery    . Cesarean section    . Abdominal hysterectomy     Family History  Problem Relation Age of Onset  . Diabetes Mother   . Hypertension Mother    History   Social History  . Marital Status: Married    Spouse Name: N/A  . Number of Children: N/A  . Years of Education: N/A   Occupational History  . Not on file.   Social History Main Topics  . Smoking status: Never Smoker   . Smokeless tobacco: Not on file  . Alcohol Use: Yes  . Drug Use: No  . Sexual Activity: Not on file   Other Topics Concern  . Not on file   Social History Narrative    Review of Systems: Review of Systems  Constitutional: Negative for fever, chills and weight loss.  HENT: Negative for hearing loss.   Eyes: Negative for blurred vision and redness.  Respiratory: Negative for cough, shortness of breath and stridor.   Gastrointestinal: Negative for heartburn, nausea and vomiting.  Genitourinary: Negative.  Negative for dysuria.  Musculoskeletal:       B/l feet pain.  Skin: Negative for itching and rash.  Neurological: Negative for dizziness, seizures, loss of consciousness, weakness and headaches.  Endo/Heme/Allergies: Negative.   Psychiatric/Behavioral: Negative.      Physical Exam: Blood pressure 167/86, pulse 71, temperature 97.3 F (36.3 C), temperature source Oral, resp. rate 16, weight  167 lb (75.751 kg), SpO2 96 %. Physical Exam  Constitutional: She is oriented to person, place, and time. She appears well-developed and well-nourished. No distress.  HENT:  Head: Normocephalic and atraumatic.  Mouth/Throat:  Oropharynx is clear and moist.  Eyes: Conjunctivae are normal. Pupils are equal, round, and reactive to light.  Neck: Normal range of motion. No JVD present.  Cardiovascular: Normal rate and regular rhythm.  Exam reveals no gallop and no friction rub.   No murmur heard. Respiratory: Effort normal and breath sounds normal. No respiratory distress. She has no wheezes. She has no rales. She exhibits no tenderness.  GI: Soft. Bowel sounds are normal. She exhibits no distension. There is no tenderness. There is no rebound.  Musculoskeletal: Normal range of motion. She exhibits no edema.  Had splint placed on RLE and LLE placed by ortho. I did not remove these. Has tenderness on both ankles.   Neurological: She is alert and oriented to person, place, and time. No cranial nerve deficit. Coordination normal.  Skin: She is not diaphoretic.  Psychiatric: She has a normal mood and affect.     Lab results: Basic Metabolic Panel:  Recent Labs  01/25/15 1548  NA 138  K 4.4  CL 101  CO2 28  GLUCOSE 193*  BUN 15  CREATININE 0.89  CALCIUM 9.3   Liver Function Tests: CBC:  Recent Labs  01/25/15 1548  WBC 8.4  NEUTROABS 6.5  HGB 11.7*  HCT 36.9  MCV 92.0  PLT 222   Imaging results:  Dg Tibia/fibula Right  01/25/2015   CLINICAL DATA:  Right ankle pain, deformity, fall down steps today  EXAM: RIGHT TIBIA AND FIBULA - 2 VIEW  COMPARISON:  Right ankle same day  FINDINGS: Four views of the right tibia-fibula submitted. There is displaced fracture in the distal right tibia and fibula with disruption of ankle mortise and dislocation of the ankle. Displaced fracture of medial tibial malleolus. There is medial displacement of distal tibia. Anteromedial displacement of  distal shaft of right fibula. Plantar and posterior spurring of calcaneus. No proximal fibular or tibial fracture. Two metallic screws are noted in right patella.  IMPRESSION: Displaced fracture of distal right tibia and fibula with ankle dislocation and medial displacement of distal tibia. Anteromedial displacement of distal shaft of right fibula.   Electronically Signed   By: Lahoma Crocker M.D.   On: 01/25/2015 15:43   Dg Ankle 2 Views Right  01/25/2015   CLINICAL DATA:  RIGHT ankle pain and deformity. Fall down steps today. Initial encounter.  EXAM: RIGHT ANKLE - 2 VIEW  COMPARISON:  None.  FINDINGS: Fracture dislocation of the RIGHT ankle is present. The talus is laterally dislocated. Displaced medial malleolus fracture. Distal fibular metaphysis fracture. Talus is also posteriorly disc located relative to the tibial plafond. The fracture appears to be a try malleolar fracture. Calcaneal spurs are incidentally noted. Apex medial angulation of the fracture components with valgus deformity of the ankle.  IMPRESSION: Trimalleolar RIGHT ankle fracture dislocation.   Electronically Signed   By: Dereck Ligas M.D.   On: 01/25/2015 15:39   Dg Ankle Complete Left  01/25/2015   CLINICAL DATA:  LEFT ankle pain and deformity. Fall down steps today. Initial encounter.  EXAM: LEFT ANKLE COMPLETE - 3+ VIEW; LEFT FOOT - COMPLETE 3+ VIEW  COMPARISON:  None.  FINDINGS: LEFT ankle: The ankle mortise is congruent. The talar dome appears intact. Tibia and fibula appear normal. On the lateral projection, there is a fifth metatarsal base fracture, probably representing a pseudo Jones fracture.  LEFT foot: The alignment of the LEFT foot is within normal limits. Pseudo Jones fracture of the LEFT fifth metatarsal base is present. Fracture  is minimally displaced, with distraction of about 2 mm. The fracture extends intra-articular at the fifth tarsometatarsal junction. No other fractures are identified.  IMPRESSION: 1. Intact LEFT  ankle. 2. Pseudo Jones fracture of the fifth metatarsal base.   Electronically Signed   By: Dereck Ligas M.D.   On: 01/25/2015 15:43   Dg Ankle Right Port  01/25/2015   CLINICAL DATA:  Postreduction trimalleolar right ankle fracture.  EXAM: PORTABLE RIGHT ANKLE - 2 VIEW  COMPARISON:  Radiographs same date.  FINDINGS: 1645 hours. The ankle has been splinted. There is improved alignment of the trimalleolar fracture with mild residual posterior subluxation of the talar dome with respect to the tibial plafond.  IMPRESSION: Improved alignment of the trimalleolar fracture with mild residual posterior subluxation of the talus.   Electronically Signed   By: Richardean Sale M.D.   On: 01/25/2015 17:00   Dg Foot Complete Left  01/25/2015   CLINICAL DATA:  LEFT ankle pain and deformity. Fall down steps today. Initial encounter.  EXAM: LEFT ANKLE COMPLETE - 3+ VIEW; LEFT FOOT - COMPLETE 3+ VIEW  COMPARISON:  None.  FINDINGS: LEFT ankle: The ankle mortise is congruent. The talar dome appears intact. Tibia and fibula appear normal. On the lateral projection, there is a fifth metatarsal base fracture, probably representing a pseudo Jones fracture.  LEFT foot: The alignment of the LEFT foot is within normal limits. Pseudo Jones fracture of the LEFT fifth metatarsal base is present. Fracture is minimally displaced, with distraction of about 2 mm. The fracture extends intra-articular at the fifth tarsometatarsal junction. No other fractures are identified.  IMPRESSION: 1. Intact LEFT ankle. 2. Pseudo Jones fracture of the fifth metatarsal base.   Electronically Signed   By: Dereck Ligas M.D.   On: 01/25/2015 15:43    Other results: EKG: none  Assessment, Plan, & Recommendations by Problem: Active Problems:   Diabetes   Ankle fracture   HTN (hypertension)   HLD (hyperlipidemia)   Asthma  64 yo female with hx of HTN, HLD, DM II here with trimalleolar right ankle fracture dislocation, pseudo jones fracture of  left 5th metatarsal base, we were consulted for medical clearance and medical management.   Based on patient's hx of DM II, HTN, and HLD, patient is low risk, MACE 0.9% risk for fixation for the fractures.  We recommend the following management:  Trimalleolar right ankle fx and pseudo jones fx of left 5th metatarsal  S/p closed reduction of her right ankle with a short leg splint. Dr. Percell Miller plans to observe patient overnight and assess her mobility with PT. Plans to take to OR few days later after the swelling on the right leg has decreased, likley on 01/31/15.  - Pain management per ortho: on norco + IV morphine prn per ortho.  DM II - unknown hgba1c.- on Toujeo 23 units + metformin 1000mg  + actos 15 mg - recommend Lantus 12 units, and SSI-sensitive here in the hospital + meal time.  - carb mod diet   HTN - on losartan-hctz 100-25mg  daily + amlodipine - cont these here here  HLD - on crestor 10mg  daily - cont this.  Asthma - cont albuterol PRN.  dvt ppx: on asa 325mg  + SCD per ortho.  Dispo: Disposition is deferred at this time, awaiting improvement of current medical problems. Anticipated discharge in approximately 1-2 day(s).   The patient does have a current PCP (No Pcp Per Patient) and does need an [facility] hospital follow-up appointment after  discharge.  The patient does have transportation limitations that hinder transportation to clinic appointments.  Signed: Dellia Nims, MD 01/25/2015, 6:57 PM

## 2015-01-26 DIAGNOSIS — W109XXA Fall (on) (from) unspecified stairs and steps, initial encounter: Secondary | ICD-10-CM

## 2015-01-26 DIAGNOSIS — S92352A Displaced fracture of fifth metatarsal bone, left foot, initial encounter for closed fracture: Secondary | ICD-10-CM | POA: Diagnosis not present

## 2015-01-26 DIAGNOSIS — S82851A Displaced trimalleolar fracture of right lower leg, initial encounter for closed fracture: Secondary | ICD-10-CM | POA: Diagnosis not present

## 2015-01-26 DIAGNOSIS — Y939 Activity, unspecified: Secondary | ICD-10-CM

## 2015-01-26 DIAGNOSIS — IMO0001 Reserved for inherently not codable concepts without codable children: Secondary | ICD-10-CM

## 2015-01-26 DIAGNOSIS — S53105A Unspecified dislocation of left ulnohumeral joint, initial encounter: Secondary | ICD-10-CM

## 2015-01-26 HISTORY — DX: Unspecified dislocation of left ulnohumeral joint, initial encounter: S53.105A

## 2015-01-26 LAB — GLUCOSE, CAPILLARY
Glucose-Capillary: 101 mg/dL — ABNORMAL HIGH (ref 65–99)
Glucose-Capillary: 138 mg/dL — ABNORMAL HIGH (ref 65–99)
Glucose-Capillary: 154 mg/dL — ABNORMAL HIGH (ref 65–99)
Glucose-Capillary: 157 mg/dL — ABNORMAL HIGH (ref 65–99)
Glucose-Capillary: 208 mg/dL — ABNORMAL HIGH (ref 65–99)
Glucose-Capillary: 99 mg/dL (ref 65–99)

## 2015-01-26 MED ORDER — DOCUSATE SODIUM 100 MG PO CAPS: 100.0000 mg | ORAL_CAPSULE | Freq: Two times a day (BID) | ORAL | Status: DC

## 2015-01-26 MED ORDER — OXYCODONE-ACETAMINOPHEN 5-325 MG PO TABS
1.0000 | ORAL_TABLET | ORAL | Status: DC | PRN
Start: 2015-01-26 — End: 2015-02-01

## 2015-01-26 MED ORDER — INSULIN ASPART 100 UNIT/ML ~~LOC~~ SOLN
0.0000 [IU] | Freq: Every day | SUBCUTANEOUS | Status: DC
  Administered 2015-01-26: 2 [IU] via SUBCUTANEOUS

## 2015-01-26 MED ORDER — ONDANSETRON HCL 4 MG PO TABS: 4.0000 mg | ORAL_TABLET | Freq: Three times a day (TID) | ORAL | Status: DC | PRN

## 2015-01-26 MED ORDER — ASPIRIN 325 MG PO TABS
325.0000 mg | ORAL_TABLET | Freq: Every day | ORAL | Status: DC
Start: 2015-01-26 — End: 2015-05-17

## 2015-01-26 MED ORDER — INSULIN ASPART 100 UNIT/ML ~~LOC~~ SOLN
3.0000 [IU] | Freq: Three times a day (TID) | SUBCUTANEOUS | Status: DC
  Administered 2015-01-27 (×2): 3 [IU] via SUBCUTANEOUS

## 2015-01-26 NOTE — Progress Notes (Signed)
OT Note - Addendum    02/06/15 1205  OT Visit Information  Last OT Received On 2015/02/06  OT G-codes **NOT FOR INPATIENT CLASS**  Functional Assessment Tool Used clinical judgement  Functional Limitation Self care  Self Care Current Status (Y5638) CK  Self Care Goal Status (L3734) CI  [facility], OTR/L  [phone] February 06, 2015

## 2015-01-26 NOTE — Progress Notes (Signed)
Physical Therapy Treatment Patient Details Name: Mary Choi MRN: [id number] DOB: 01-10-1951 Today's Date: 01/26/2015    History of Present Illness Pt is a 64 y/o F s/p fall down stairs now w/ L trimalleolar fx and R 5th metatarsal fx.  Plan is for surgery on Tuesday, 01/31/15.  Pt's PMH includes cancer (lymphedema RUE), asthma, DM, HTN.    PT Comments    Pt making good progress this session and demonstrated ability to ambulate to bathroom and back w/ min guard.  Anticipate pt will be appropriate for stair training next session.  Pt reports she likely will not be able to use WC in her home as her doors are not wide enough to accommodate and there is a door to each room.  Pt's mom will be available prn to assist w/ ADLs and cooking and brother and potentially sister in law and daughter will be available to assist pt w/ getting into home upon d/c, will follow up on this at next session.  Pt will benefit from continued skilled PT services to increase functional independence and safety.   Follow Up Recommendations  Home health PT;Supervision for mobility/OOB     Equipment Recommendations  Rolling walker with 5" wheels;3in1 (PT)    Recommendations for Other Services       Precautions / Restrictions Precautions Precautions: Fall Precaution Comments: s/p fall down stairs Required Braces or Orthoses: Other Brace/Splint Other Brace/Splint: L CAM boot Restrictions Weight Bearing Restrictions: Yes RLE Weight Bearing: Non weight bearing LLE Weight Bearing: Weight bearing as tolerated    Mobility  Bed Mobility Overal bed mobility: Modified Independent             General bed mobility comments: in recliner  Transfers Overall transfer level: Needs assistance Equipment used: Rolling walker (2 wheeled) Transfers: Sit to/from Stand Sit to Stand: Min guard Stand pivot transfers: Min assist       General transfer comment: Min guard for safety.  VCs to "tuck" L foot closer to chair  when standing so she can push through to stand.    Ambulation/Gait Ambulation/Gait assistance: Min guard Ambulation Distance (Feet): 20 Feet Assistive device: Rolling walker (2 wheeled) Gait Pattern/deviations: Step-to pattern;Antalgic   Gait velocity interpretation: Below normal speed for age/gender General Gait Details: Pt using technique taught by OT to think about the sequencing w/ the RW and hopping on LLE.  Pt able to hop to bathroom and back, c/o fatigue at end of session.     Stairs            Wheelchair Mobility    Modified Rankin (Stroke Patients Only)       Balance Overall balance assessment: Needs assistance Sitting-balance support: No upper extremity supported;Feet supported Sitting balance-Leahy Scale: Good     Standing balance support: Bilateral upper extremity supported;During functional activity Standing balance-Leahy Scale: Poor Standing balance comment: relies on RW for support                    Cognition Arousal/Alertness: Awake/alert Behavior During Therapy: WFL for tasks assessed/performed Overall Cognitive Status: Within Functional Limits for tasks assessed                      Exercises General Exercises - Lower Extremity Quad Sets: AROM;Both;5 reps;Supine Straight Leg Raises: AROM;Both;5 reps;Supine    General Comments General comments (skin integrity, edema, etc.): Pt reports she will not be able to use WC in her home as her doors are not  wide enough to accommodate and there is a door to each room.  Pt's mom will be available prn to assist w/ ADLs and cooking and brother and potentially sister in law and daughter will be available to assist pt w/ getting into home upon d/c.      Pertinent Vitals/Pain Pain Assessment: 0-10 Pain Score: 2  Pain Location: L foot Pain Descriptors / Indicators: Aching Pain Intervention(s): Limited activity within patient's tolerance;Monitored during session;Repositioned    Home Living  Family/patient expects to be discharged to:: Private residence Living Arrangements: Alone Available Help at Discharge: Family;Available PRN/intermittently Type of Home: House Home Access: Stairs to enter Entrance Stairs-Rails: None Home Layout: Two level;Able to live on main level with bedroom/bathroom Home Equipment:  (walking stick)      Prior Function Level of Independence: Independent          PT Goals (current goals can now be found in the care plan section) Acute Rehab PT Goals Patient Stated Goal: to be able to move around better PT Goal Formulation: With patient Time For Goal Achievement: 02/02/15 Potential to Achieve Goals: Good Additional Goals Additional Goal #1: Pt will verbalize understanding of proper technique for WC management up/down stairs using teachback. Progress towards PT goals: Progressing toward goals    Frequency  Min 5X/week    PT Plan Current plan remains appropriate    Co-evaluation             End of Session Equipment Utilized During Treatment: Gait belt;Other (comment) (L CAM boot) Activity Tolerance: Patient limited by fatigue;Patient tolerated treatment well Patient left: in chair;with call bell/phone within reach     Time: 1440-1510 PT Time Calculation (min) (ACUTE ONLY): 30 min  Charges:  $Gait Training: 23-37 mins $Therapeutic Activity: 8-22 mins                    G Codes:  Functional Assessment Tool Used: Clinical Judgement Functional Limitation: Mobility: Walking and moving around Mobility: Walking and Moving Around Current Status (N3567): At least 20 percent but less than 40 percent impaired, limited or restricted Mobility: Walking and Moving Around Goal Status [phone]): At least 1 percent but less than 20 percent impaired, limited or restricted   Joslyn Hy PT, DPT [phone] Pager: [phone] 01/26/2015, 3:28 PM

## 2015-01-26 NOTE — Progress Notes (Signed)
     Subjective:  S/P R ankle closed reduction. Patient reports pain as mild to moderate.  Resting comfortably in bed. The medicine team consulted for pre-op clearance.  They feel she is low risk at this time.  She will need to be set up with a PCP, since she has no one to manage chronic conditions at this time.   Objective:   VITALS:   Filed Vitals:   01/25/15 1930 01/25/15 1945 01/25/15 2024 01/26/15 0604  BP: 164/85 122/93 128/86 107/68  Pulse: 99 94 85 73  Temp:   97.7 F (36.5 C) 98.2 F (36.8 C)  TempSrc:      Resp: 10  15 15   Weight:      SpO2: 96%  99% 99%    Neurologically intact ABD soft Neurovascular intact Sensation intact distally Intact pulses distally R leg splinted, L leg in CAM boot  Lab Results  Component Value Date   WBC 8.4 01/25/2015   HGB 11.7* 01/25/2015   HCT 36.9 01/25/2015   MCV 92.0 01/25/2015   PLT 222 01/25/2015   BMET    Component Value Date/Time   NA 138 01/25/2015 1548   K 4.4 01/25/2015 1548   CL 101 01/25/2015 1548   CO2 28 01/25/2015 1548   GLUCOSE 193* 01/25/2015 1548   BUN 15 01/25/2015 1548   CREATININE 0.89 01/25/2015 1548   CALCIUM 9.3 01/25/2015 1548   GFRNONAA >60 01/25/2015 1548   GFRAA >60 01/25/2015 1548     Assessment/Plan:     Active Problems:   Diabetes   Ankle fracture   HTN (hypertension)   HLD (hyperlipidemia)   Asthma   Up with therapy WBAT in the LLE, NWB in the RLE ASA 325mg  daily for DVT prophylaxis  We will plan to see how the patient does with mobilization with PT today.  If she does well, then the plan will be to discharge home with follow up outpatient to schedule surgery for early next week.    Keimora Swartout [name] 01/26/2015, 6:46 AM Cell [phone]

## 2015-01-26 NOTE — Care Management (Signed)
Utilization review completed. Jaivon Vanbeek, RN Case Manager [phone]. 

## 2015-01-26 NOTE — Progress Notes (Signed)
Occupational Therapy Treatment Patient Details Name: Mary Choi MRN: [id number] DOB: Feb 01, 1951 Today's Date: 01/26/2015    History of present illness Pt is a 64 y/o F s/p fall down stairs now w/ L trimalleolar fx and R 5th metatarsal fx.  Plan is for surgery on Tuesday, 01/31/15.  Pt's PMH includes cancer (lymphedema RUE), asthma, DM, HTN.   OT comments  Seen again this pm. Improving with ability to mobilize and complete ADL tasks. Ongoing eduction regarding set up and compensatory techniques to maximize functional level of independence to facilitate safe D/C home. Will see in am.  Follow Up Recommendations  Home health OT;Supervision - Intermittent;Other (comment) ([place] Aide)    Equipment Recommendations  3 in 1 bedside comode    Recommendations for Other Services      Precautions / Restrictions Precautions Precautions: Fall Precaution Comments: s/p fall down stairs Required Braces or Orthoses: Other Brace/Splint Other Brace/Splint: L CAM boot Restrictions Weight Bearing Restrictions: Yes RLE Weight Bearing: Non weight bearing LLE Weight Bearing: Weight bearing as tolerated       Mobility Bed Mobility               General bed mobility comments: in recliner  Transfers Overall transfer level: Needs assistance Equipment used: Rolling walker (2 wheeled) Transfers: Sit to/from Stand Sit to Stand: Min guard         General transfer comment: Min guard for safety.  VCs position self to edge of chair and use momentuma dn position of head to safely come to stand   Balance Overall balance assessment: Needs assistance Sitting-balance support: No upper extremity supported;Feet supported Sitting balance-Leahy Scale: Good     Standing balance support: Bilateral upper extremity supported;During functional activity Standing balance-Leahy Scale: Fair Standing balance comment: relies on RW for support                   ADL       Grooming: Wash/dry  hands;Standing;Supervision/safety                   Toilet Transfer: Minimal assistance;Ambulation;BSC   Toileting- Clothing Manipulation and Hygiene: Supervision/safety       Functional mobility during ADLs: Minimal assistance;Rolling walker;Cueing for safety;Cueing for sequencing General ADL Comments: Improved performance this pm. Worked on using momentum and positionof head to increase independence with trnsitional movements from sit - stand. Discussed home safety adn using walker bag. discussed possibility of using reacher for LB dressing and home use.                                       Cognition   Behavior During Therapy: WFL for tasks assessed/performed Overall Cognitive Status: Within Functional Limits for tasks assessed                                                 General Comments  Pt states she feels more confident after second session.    Pertinent Vitals/ Pain       Pain Assessment: 0-10 Pain Score: 3  Pain Location: B ankles Pain Descriptors / Indicators: Aching Pain Intervention(s): Limited activity within patient's tolerance;Repositioned  Home Living  Prior Functioning/Environment              Frequency Min 3X/week     Progress Toward Goals  OT Goals(current goals can now be found in the care plan section)  Progress towards OT goals: Progressing toward goals  Acute Rehab OT Goals Patient Stated Goal: to be able to go home safely OT Goal Formulation: With patient Time For Goal Achievement: 02/02/15 Potential to Achieve Goals: Good ADL Goals Pt Will Perform Lower Body Bathing: with supervision;with set-up;sit to/from stand Pt Will Perform Lower Body Dressing: with set-up;with supervision;sit to/from stand Pt Will Transfer to Toilet: with supervision;ambulating;bedside commode Pt Will Perform Toileting - Clothing Manipulation and hygiene: with  modified independence;sit to/from stand  Plan Discharge plan remains appropriate    Co-evaluation                 End of Session Equipment Utilized During Treatment: Gait belt;Rolling walker   Activity Tolerance Patient tolerated treatment well   Patient Left in chair;with call bell/phone within reach   Nurse Communication Mobility status        Time: 4742-5956 OT Time Calculation (min): 20 min  Charges: OT General Charges $OT Visit: 1 Procedure OT Treatments $Self Care/Home Management : 8-22 mins  Shep Porter,HILLARY 01/26/2015, 5:04 PM   [facility], OTR/L  [phone] 01/26/2015

## 2015-01-26 NOTE — Progress Notes (Signed)
Orthopedic Tech Progress Note Patient Details:  Mary Choi 06/26/1951 [id number]  Patient ID: Mary Choi, female   DOB: [birth year], 64 y.o.   MRN: [id number] Pt stated that she in able to move about in and out of bed without use of trapeze bar patient helper; RN notified  Hildred Priest 01/26/2015, 7:47 AM

## 2015-01-26 NOTE — Care Management Note (Signed)
Case Management Note  Patient Details  Name: Mary Choi MRN: [id number] Date of Birth: [birth year]  Subjective/Objective:           Right ankle fracture         Action/Plan: Spoke with patient about HHC. She chose [facility]. Contacted Miranda at [facility] and set up [employer]. Patient lives alone and is working on arranging someone to assist her at home. Will continue to follow for discharge needs.  Expected Discharge Date:                  Expected Discharge Plan:  [place]  In-House Referral:  NA  Discharge planning Services  CM Consult  Post Acute Care Choice:  Durable Medical Equipment, Home Health Choice offered to:  Patient  DME Arranged:    DME Agency:     HH Arranged:  PT, OT HH Agency:  [place]  Status of Service:  In process, will continue to follow  Medicare Important Message Given:    Date Medicare IM Given:    Medicare IM give by:    Date Additional Medicare IM Given:    Additional Medicare Important Message give by:     If discussed at [place] of Stay Meetings, dates discussed:    Additional Comments:  Nila Nephew, RN 01/26/2015, 4:43 PM

## 2015-01-26 NOTE — Progress Notes (Signed)
Subjective: Patient is doing well other than the pain on her ankles. Interested in seeing endocrinology, thinks her DM is not well controlled. She said her hgba1c was <7. We re-assured her this is very good control. States her CBG are always <200, only one episode of hypoglycemia in 20's 1 month ago.   Denies any other complaints.   Objective: Vital signs in last 24 hours: Filed Vitals:   01/25/15 1930 01/25/15 1945 01/25/15 2024 01/26/15 0604  BP: 164/85 122/93 128/86 107/68  Pulse: 99 94 85 73  Temp:   97.7 F (36.5 C) 98.2 F (36.8 C)  TempSrc:      Resp: 10  15 15   Weight:      SpO2: 96%  99% 99%   Weight change:   Intake/Output Summary (Last 24 hours) at 01/26/15 1248 Last data filed at 01/25/15 2001  Gross per 24 hour  Intake      0 ml  Output    150 ml  Net   -150 ml   Vitals reviewed. General: resting in bed, NAD HEENT: PERRL, EOMI, no scleral icterus Cardiac: RRR, no rubs, murmurs or gallops Pulm: clear to auscultation bilaterally, no wheezes, rales, or rhonchi Abd: soft, nontender, nondistended, BS present Ext: splints placed on BLE's.  Neuro: alert and oriented X3, cranial nerves II-XII grossly intact  Lab Results: Basic Metabolic Panel:  Recent Labs Lab 01/25/15 1548  NA 138  K 4.4  CL 101  CO2 28  GLUCOSE 193*  BUN 15  CREATININE 0.89  CALCIUM 9.3     Recent Labs Lab 01/25/15 1548  WBC 8.4  NEUTROABS 6.5  HGB 11.7*  HCT 36.9  MCV 92.0  PLT 222   CBG:  Recent Labs Lab 01/25/15 1959 01/26/15 0007 01/26/15 0601 01/26/15 0757 01/26/15 1123  GLUCAP 215* 157* 101* 154* 99    Micro Results: No results found for this or any previous visit (from the past 240 hour(s)). Studies/Results: Dg Tibia/fibula Right  01/25/2015   CLINICAL DATA:  Right ankle pain, deformity, fall down steps today  EXAM: RIGHT TIBIA AND FIBULA - 2 VIEW  COMPARISON:  Right ankle same day  FINDINGS: Four views of the right tibia-fibula submitted. There is  displaced fracture in the distal right tibia and fibula with disruption of ankle mortise and dislocation of the ankle. Displaced fracture of medial tibial malleolus. There is medial displacement of distal tibia. Anteromedial displacement of distal shaft of right fibula. Plantar and posterior spurring of calcaneus. No proximal fibular or tibial fracture. Two metallic screws are noted in right patella.  IMPRESSION: Displaced fracture of distal right tibia and fibula with ankle dislocation and medial displacement of distal tibia. Anteromedial displacement of distal shaft of right fibula.   Electronically Signed   By: Lahoma Crocker M.D.   On: 01/25/2015 15:43   Dg Ankle 2 Views Right  01/25/2015   CLINICAL DATA:  RIGHT ankle pain and deformity. Fall down steps today. Initial encounter.  EXAM: RIGHT ANKLE - 2 VIEW  COMPARISON:  None.  FINDINGS: Fracture dislocation of the RIGHT ankle is present. The talus is laterally dislocated. Displaced medial malleolus fracture. Distal fibular metaphysis fracture. Talus is also posteriorly disc located relative to the tibial plafond. The fracture appears to be a try malleolar fracture. Calcaneal spurs are incidentally noted. Apex medial angulation of the fracture components with valgus deformity of the ankle.  IMPRESSION: Trimalleolar RIGHT ankle fracture dislocation.   Electronically Signed   By: Dereck Ligas  M.D.   On: 01/25/2015 15:39   Dg Ankle Complete Left  01/25/2015   CLINICAL DATA:  LEFT ankle pain and deformity. Fall down steps today. Initial encounter.  EXAM: LEFT ANKLE COMPLETE - 3+ VIEW; LEFT FOOT - COMPLETE 3+ VIEW  COMPARISON:  None.  FINDINGS: LEFT ankle: The ankle mortise is congruent. The talar dome appears intact. Tibia and fibula appear normal. On the lateral projection, there is a fifth metatarsal base fracture, probably representing a pseudo Jones fracture.  LEFT foot: The alignment of the LEFT foot is within normal limits. Pseudo Jones fracture of the  LEFT fifth metatarsal base is present. Fracture is minimally displaced, with distraction of about 2 mm. The fracture extends intra-articular at the fifth tarsometatarsal junction. No other fractures are identified.  IMPRESSION: 1. Intact LEFT ankle. 2. Pseudo Jones fracture of the fifth metatarsal base.   Electronically Signed   By: Dereck Ligas M.D.   On: 01/25/2015 15:43   Dg Ankle Complete Right  01/25/2015   CLINICAL DATA:  64 year old female with distal tibial and fibular fracture status post cast placement. Follow up study.  EXAM: RIGHT ANKLE - COMPLETE 3+ VIEW  COMPARISON:  Radiograph dated 01/25/2015.  FINDINGS: Evaluation of the study is limited due to overlying cast.  Oblique fracture of the distal fibula with approximately 4 mm lateral displacement of the distal fracture fragment similar to prior study. There is nondisplaced fracture of the medial malleolus. There is anatomic alignment of the talotibial articulation.  IMPRESSION: Fractures of the distal fibula and medial malleolus.  Anatomic alignment of the talotibial articulation.   Electronically Signed   By: Anner Crete M.D.   On: 01/25/2015 19:07   Dg Ankle Right Port  01/25/2015   CLINICAL DATA:  Postreduction trimalleolar right ankle fracture.  EXAM: PORTABLE RIGHT ANKLE - 2 VIEW  COMPARISON:  Radiographs same date.  FINDINGS: 1645 hours. The ankle has been splinted. There is improved alignment of the trimalleolar fracture with mild residual posterior subluxation of the talar dome with respect to the tibial plafond.  IMPRESSION: Improved alignment of the trimalleolar fracture with mild residual posterior subluxation of the talus.   Electronically Signed   By: Richardean Sale M.D.   On: 01/25/2015 17:00   Dg Foot Complete Left  01/25/2015   CLINICAL DATA:  LEFT ankle pain and deformity. Fall down steps today. Initial encounter.  EXAM: LEFT ANKLE COMPLETE - 3+ VIEW; LEFT FOOT - COMPLETE 3+ VIEW  COMPARISON:  None.  FINDINGS: LEFT  ankle: The ankle mortise is congruent. The talar dome appears intact. Tibia and fibula appear normal. On the lateral projection, there is a fifth metatarsal base fracture, probably representing a pseudo Jones fracture.  LEFT foot: The alignment of the LEFT foot is within normal limits. Pseudo Jones fracture of the LEFT fifth metatarsal base is present. Fracture is minimally displaced, with distraction of about 2 mm. The fracture extends intra-articular at the fifth tarsometatarsal junction. No other fractures are identified.  IMPRESSION: 1. Intact LEFT ankle. 2. Pseudo Jones fracture of the fifth metatarsal base.   Electronically Signed   By: Dereck Ligas M.D.   On: 01/25/2015 15:43   Medications: I have reviewed the patient's current medications. Scheduled Meds: . amLODipine  10 mg Oral Daily  . aspirin EC  325 mg Oral Daily  . docusate sodium  100 mg Oral BID  . hydrochlorothiazide  25 mg Oral Daily  . insulin aspart  0-5 Units Subcutaneous QHS  . insulin  aspart  0-9 Units Subcutaneous TID WC  . insulin aspart  3 Units Subcutaneous TID WC  . insulin glargine  12 Units Subcutaneous QHS  . losartan  100 mg Oral Daily  . rosuvastatin  10 mg Oral Daily  . cyanocobalamin  2,000 mcg Oral Daily   Continuous Infusions: . sodium chloride 75 mL/hr at 01/25/15 2207   PRN Meds:.acetaminophen, albuterol, cyclobenzaprine, HYDROcodone-acetaminophen, morphine injection Assessment/Plan: Active Problems:   Diabetes   Ankle fracture   HTN (hypertension)   HLD (hyperlipidemia)   Asthma   Dislocation  Trimalleolar right ankle fx and pseudo jones left 5th metatarsal fracture - management per ortho. Plan for surgery next week with Dr. Percell Miller.  DM II - hgba1c last was <7 per patient's report. On Toujeo 23 units + metformin 1000mg  + actos 15 mg - CBGs has been well controlled under 200 with Lantus 12 units + SSI-sensitive + meal time. - continue home meds on discharge. Needs to follow up with PCP for  chronic diabetic management. May consider lowering Toujeo dose to 15 units with her hypoglycemia. 23units may be too high for her. - f/up hgba1c.  HTN - cont losartan-hctz and amlodipine  HLD - cont crestor 10mg  daily  Asthma - cont prn albuterol  dvt ppx: asa 325mg  + scd per ortho  Ok for discharge from our side.  Low risk for fixation procedure of her fractures.    Dispo: Disposition is deferred at this time, awaiting improvement of current medical problems.  Anticipated discharge in approximately today(s).   The patient does have a current PCP (No Pcp Per Patient) and does need an [facility] hospital follow-up appointment after discharge.  The patient does have transportation limitations that hinder transportation to clinic appointments.  .Services Needed at time of discharge: Y = Yes, Blank = No PT:   OT:   RN:   Equipment:   Other:     LOS: 1 day   Dellia Nims, MD 01/26/2015, 12:48 PM

## 2015-01-26 NOTE — Discharge Instructions (Signed)
Non-weight bearing in the Left Leg  Weight bearing as tolerated in the Right leg as long as in the CAM boot  ASA 325mg  daily for DVT prophylaxis   Bimalleolar Fracture, Ankle, Adult, Displaced (ORIF) A bimalleolar fracture (break in bone) is two fractures in the lower bones of your leg that help to make up your ankle. These fractures are in the bone you feel as the bump on the outside of your ankle (fibula) and the bone that you feel as the bump on the inside of your ankle (tibia). Your fractures are displaced. This means the bones are not in their normal position and will not give a good result if they heal in that position. Because of this, surgery is required. This is called an open reduction and internal fixation (ORIF). Even with the best of care and perfect results this ankle may be more prone to be arthritis later due to damage of the cartilage lining the ankle joint which is not visible on X-ray. These fractures are easily diagnosed with X-rays. TREATMENT  You have fractures that would probably heal with disability, without surgery. Open reduction means that the area of the fracture is opened up to the vision of the surgeon and internal fixation means that a screw, pins or fixation device is used to hold the boney pieces in place. Following surgery a short-leg cast or removable fracture boot is then applied from your toes to below your knee. This is generally left in place for about 5 to 6 weeks, during which time it is followed by your caregiver and X-rays may be taken to make sure the bones stay in place. RISKS & COMPLICATIONS: All surgery is associated with risks. Some of these risks are:  Excessive bleeding  Infection  Failure to heal properly resulting in an unstable or arthritic ankle  Stiffness of ankle following repair LET YOUR CAREGIVERS KNOW ABOUT:  Allergies.  Medications taken including herbs, eye drops, over-the-counter medications, and creams.  Use of steroids (by  mouth or creams).  History of bleeding or blood problems.  Previous problems with anesthetics or numbing medication, including a family history of these problems.  Possibility of pregnancy, if this applies.  History of blood clots (thrombophlebitis).  Previous surgery.  Other health problems. BEFORE AND AFTER YOUR SURGERY Prior to surgery an IV (intravenous line connected to your vein for giving fluids) may be started and you will be given an anesthetic (medications and gas to make you sleep). You may also be given a regional anesthetic such as a spinal or epidural block. After surgery, you will be taken to the recovery area where a nurse will monitor your progress. You may have a catheter (a long, narrow, hollow tube) in your bladder following surgery that helps you pass your water. When you are awake, are stable, taking fluids well and without complications, you will be returned to your room. You will receive physical therapy and other care until you are doing well and your caregiver feels it is safe for you to be transferred either to home or to an extended care facility. HOME CARE INSTRUCTIONS   You may resume normal diet and activities as directed or allowed.  Do not drive a vehicle until your caregiver specifically tells you it is safe to do so.  Keep ice packs (a bag of ice wrapped in a towel) on the surgical area for 20 minutes, 4 times per day, for the first two days following surgery. Use the ice only if  okay with your surgeon or caregiver.  Elevate your ankle above your heart as much as possible for the first 24 to 48 hours after the operation.  Change dressings if necessary or as directed.  If you have a plaster or fiberglass cast:  Do not try to scratch the skin under the cast using sharp or pointed objects.  Check the skin around the cast every day. You may put lotion on any red or sore areas.  Keep your cast dry and clean.  Do not put pressure on any part of your  cast or splint until it is fully hardened.  Your cast or splint can be protected during bathing with a plastic bag. Do not lower the cast or splint into water.  Only take over-the-counter or prescription medicines for pain, discomfort, or fever as directed by your caregiver.  Use crutches as directed and do not exercise leg unless instructed.  These are not fractures to be taken lightly! If these bones become displaced and get out of position, it may eventually lead to arthritis and disability for the rest of your life. Problems often follow even the best of care. Follow the directions of your caregiver.  Keep appointments as directed. SEEK IMMEDIATE MEDICAL CARE IF:   Redness, swelling, numbness, or increasing pain in the wound.  Pus coming from wound.  An unexplained oral temperature above 102 F (38.9 C) or as your caregiver suggests.  A bad smell coming from the wound or dressing.  A breaking open of the wound (edges not staying together) after sutures or staples have been removed.  Your skin or nails below the injury turn blue or gray, or feel cold or numb.  You develop severe pain under the cast or in your foot. Especially when someone else moves your toes. Follow all instructions given to you by your caregiver, make and keep follow up appointments, and use crutches as directed. If you do not have a window in your cast for observing the wound, a discharge, or minor bleeding may show up as a stain on the outside of your dressings, your cast, or plaster splint. Report these findings to your caregiver. MAKE SURE YOU:   Understand these instructions.  Will watch your condition.  Will get help right away if you are not doing well or get worse. Document Released: 04/24/2005 Document Revised: 10/07/2011 Document Reviewed: 02/17/2008 [facility] Patient Information 2015 [place], [place]. This information is not intended to replace advice given to you by your health care provider. Make  sure you discuss any questions you have with your health care provider.  Ankle Fracture A fracture is a break in a bone. A cast or splint may be used to protect the ankle and heal the break. Sometimes, surgery is needed. HOME CARE  Use crutches as told by your doctor. It is very important that you use your crutches correctly.  Do not put weight or pressure on the injured ankle until told by your doctor.  Keep your ankle raised (elevated) when sitting or lying down.  Apply ice to the ankle:  Put ice in a plastic bag.  Place a towel between your cast and the bag.  Leave the ice on for 20 minutes, 2-3 times a day.  If you have a plaster or fiberglass cast:  Do not try to scratch under the cast with any objects.  Check the skin around the cast every day. You may put lotion on red or sore areas.  Keep your cast dry  and clean.  If you have a plaster splint:  Wear the splint as told by your doctor.  You can loosen the elastic around the splint if your toes get numb, tingle, or turn cold or blue.  Do not put pressure on any part of your cast or splint. It may break. Rest your plaster splint or cast only on a pillow the first 24 hours until it is fully hardened.  Cover your cast or splint with a plastic bag during showers.  Do not lower your cast or splint into water.  Take medicine as told by your doctor.  Do not drive until your doctor says it is safe.  Follow-up with your doctor as told. It is very important that you go to your follow-up visits. GET HELP IF: The swelling and discomfort gets worse.  GET HELP RIGHT AWAY IF:   Your splint or cast breaks.  You continue to have very bad pain.  You have new pain or swelling after your splint or cast was put on.  Your skin or toes below the injured ankle:  Turn blue or gray.  Feel cold, numb, or you cannot feel them.  There is a bad smell or yellowish white fluid (pus) coming from under the splint or cast. MAKE SURE  YOU:   Understand these instructions.  Will watch your condition.  Will get help right away if you are not doing well or get worse. Document Released: 05/12/2009 Document Revised: 05/05/2013 Document Reviewed: 02/11/2013 [facility] Patient Information 2015 [place], [place]. This information is not intended to replace advice given to you by your health care provider. Make sure you discuss any questions you have with your health care provider.

## 2015-01-26 NOTE — Progress Notes (Signed)
OT Evaluation  Pt needs HHOT after D/C and would benefit from [facility] Aide due to limited caregiver support.   01/26/15 1200  OT Visit Information  Last OT Received On 01/26/15  Assistance Needed +1  History of Present Illness s/p fall with resulting  Precautions  Precautions Fall  Restrictions  Weight Bearing Restrictions Yes  RLE Weight Bearing NWB  LLE Weight Bearing WBAT  Home Living  Family/patient expects to be discharged to: Private residence  Living Arrangements Alone  Available Help at Discharge Family;Available PRN/intermittently  Type of Home House  Home Access Stairs to enter  Entrance Stairs-Number of Steps 2  Entrance Stairs-Rails None  Home Layout Two level;Able to live on main level with bedroom/bathroom  Bathroom Shower/Tub Tub/shower unit  Bathroom Accessibility No  Prior Function  Level of Independence Independent  Communication  Communication No difficulties  Pain Assessment  Pain Assessment 0-10  Pain Score 5  Pain Location L foot  Pain Descriptors / Indicators Aching  Pain Intervention(s) Limited activity within patient's tolerance;Repositioned  Cognition  Arousal/Alertness Awake/alert  Behavior During Therapy WFL for tasks assessed/performed  Overall Cognitive Status Within Functional Limits for tasks assessed  Upper Extremity Assessment  Upper Extremity Assessment RUE deficits/detail  RUE Deficits / Details 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  Lower Extremity Assessment  Lower Extremity Assessment Defer to PT evaluation  Cervical / Trunk Assessment  Cervical / Trunk Assessment Normal  ADL  Overall ADL's  Needs assistance/impaired  Grooming Set up;Standing;Min guard  Upper Body Bathing Set up;Sitting  Lower Body Bathing Minimal assistance;Sit to/from stand  Upper Body Dressing  Set up  Lower Body Dressing Minimal assistance;Sit to/from Retail buyer Minimal assistance;Ambulation;BSC  Toileting- Clothing Manipulation and  Hygiene Minimal assistance  Functional mobility during ADLs Minimal assistance;Rolling walker;Cueing for safety;Cueing for sequencing  General ADL Comments Educated pt on home set up and possibility of using w/c at home. discussed staying in den and using 3 in 1 due to limited RUE strength and NWB status   Transfers  Overall transfer level Needs assistance  Equipment used Rolling walker (2 wheeled)  Transfers Sit to/from [facility]  Sit to Stand Min assist  Stand pivot transfers Min assist  General transfer comment assist to power up from chair.  Balance  Sitting balance-Leahy Scale Good  Standing balance-Leahy Scale Poor  OT - End of Session  Equipment Utilized During Treatment Gait belt;Rolling walker  Activity Tolerance Patient tolerated treatment well  Patient left in chair;with call bell/phone within reach  Nurse Communication Mobility status  OT Assessment  OT Therapy Diagnosis  Generalized weakness;Acute pain  OT Recommendation/Assessment Patient needs continued OT Services  OT Problem List Decreased strength;Decreased range of motion;Decreased activity tolerance;Impaired balance (sitting and/or standing);Decreased knowledge of use of DME or AE;Decreased knowledge of precautions;Pain  OT Plan  OT Frequency (ACUTE ONLY) Min 3X/week  OT Recommendation  Follow Up Recommendations Home health OT;Supervision - Intermittent  OT Equipment 3 in 1 bedside comode  Individuals Consulted  Consulted and Agree with Results and Recommendations Patient  Acute Rehab OT Goals  Patient Stated Goal to be able to go home  OT Goal Formulation With patient  Time For Goal Achievement 02/02/15  Potential to Achieve Goals Good  OT Time Calculation  OT Start Time (ACUTE ONLY) 1213  OT Stop Time (ACUTE ONLY) 1236  OT Time Calculation (min) 23 min  OT General Charges  $OT Visit 1 Procedure  OT Evaluation  $Initial OT Evaluation Tier  I 1 Procedure  [facility], OTR/L   [phone] 01/26/2015

## 2015-01-26 NOTE — Progress Notes (Signed)
PT Evaluation Note   01/26/15 1146  PT Visit Information  Last PT Received On 01/26/15  Assistance Needed +1  History of Present Illness Pt is a 64 y/o F s/p fall down stairs now w/ L trimalleolar fx and R 5th metatarsal fx.  Plan is for surgery on Tuesday, 01/31/15.  Pt's PMH includes cancer (lymphedema RUE), asthma, DM, HTN.  Precautions  Precautions Fall  Precaution Comments s/p fall down stairs  Required Braces or Orthoses Other Brace/Splint  Other Brace/Splint L CAM boot  Restrictions  Weight Bearing Restrictions Yes  RLE Weight Bearing NWB  LLE Weight Bearing WBAT  Home Living  Family/patient expects to be discharged to: Private residence  Living Arrangements Alone  Available Help at Discharge Family;Available PRN/intermittently (brother lives 10 min away; mom is her neighbor)  Type of [place] to enter  Entrance Stairs-Number of Steps 2  Entrance Stairs-Rails None  Home Layout Two level;Able to live on main level with bedroom/bathroom  Home Equipment (walking stick)  Prior Function  Level of Independence Independent  Communication  Communication No difficulties  Pain Assessment  Pain Assessment 0-10  Pain Score 5  Pain Location L foot w/ ambulation  Pain Descriptors / Indicators Aching;Grimacing;Moaning  Pain Intervention(s) Limited activity within patient's tolerance;Monitored during session  Cognition  Arousal/Alertness Awake/alert  Behavior During Therapy WFL for tasks assessed/performed  Overall Cognitive Status Within Functional Limits for tasks assessed  Upper Extremity Assessment  Upper Extremity Assessment Defer to OT evaluation (weakness RUE 2/2 h/o lymphedema)  Lower Extremity Assessment  Lower Extremity Assessment RLE deficits/detail;LLE deficits/detail  RLE Deficits / Details s/p fall w/ trimalleolar fx  LLE Deficits / Details L 5th metatarsal fx  Cervical / Trunk Assessment  Cervical / Trunk Assessment Normal  Bed Mobility   Overal bed mobility Modified Independent  General bed mobility comments Use of bed rails, increased time, VCs for sequencing  Transfers  Overall transfer level Needs assistance  Equipment used Rolling walker (2 wheeled)  Transfers Sit to/from [facility]  Sit to Stand Min assist  Stand pivot transfers Min assist  General transfer comment Min assist to maintain balance during sit>stand and stand pivot transfers.  Cues for proper use of RW.  Pt able to hop x4 but shuffles her L foot for the remainder of the transfer, c/o weakness in RUE and fatigue.  Ambulation/Gait  General Gait Details Did not attempt this session 2/2 pt's instability and quick fatigue w/ transfers.  Balance  Overall balance assessment Needs assistance  Sitting-balance support Bilateral upper extremity supported;Feet supported  Sitting balance-Leahy Scale Good  Standing balance support Bilateral upper extremity supported;During functional activity  Standing balance-Leahy Scale Poor  Standing balance comment needs assist from PT to maintain balance during transfers  General Comments  General comments (skin integrity, edema, etc.) Did not attempt using knee walker this session 2/2 pt's fatigue and unsteadiness w/ stand pivot transfer.  OT worked w/ pt following PT session and per OT pt was able to ambulate to bathroom (see OT note)  Exercises  Exercises General Lower Extremity  General Exercises - Lower Extremity  Quad Sets AROM;Both;5 reps;Supine  Straight Leg Raises AROM;Both;5 reps;Supine  PT - End of Session  Equipment Utilized During Treatment Gait belt;Other (comment) (L CAM boot)  Activity Tolerance Patient limited by fatigue;Patient limited by pain  Patient left in chair;Other (comment) (w/ OT in room)  Nurse Communication Mobility status;Precautions;Weight bearing status  PT Assessment  PT Therapy Diagnosis  Difficulty walking;Abnormality of gait;Generalized weakness;Acute pain  PT  Recommendation/Assessment Patient needs continued PT services  PT Problem List Decreased strength;Decreased range of motion;Decreased activity tolerance;Decreased balance;Decreased mobility;Decreased coordination;Decreased knowledge of use of DME;Decreased safety awareness;Decreased knowledge of precautions;Pain;Decreased skin integrity  Barriers to Discharge Inaccessible home environment;Decreased caregiver support  Barriers to Discharge Comments 2 steps to enter home and assist only available intermittently  PT Plan  PT Frequency (ACUTE ONLY) Min 5X/week  PT Recommendation  Follow Up Recommendations Home health PT;Supervision for mobility/OOB  PT equipment Rolling walker with 5" wheels;3in1 (PT);Wheelchair (measurements PT);Wheelchair cushion (measurements PT)  Individuals Consulted  Consulted and Agree with Results and Recommendations Patient  Acute Rehab PT Goals  Patient Stated Goal to be able to move around better  PT Goal Formulation With patient  Time For Goal Achievement 02/02/15  Potential to Achieve Goals Good  PT Time Calculation  PT Start Time (ACUTE ONLY) 1148  PT Stop Time (ACUTE ONLY) 1213  PT Time Calculation (min) (ACUTE ONLY) 25 min  PT G-Codes **NOT FOR INPATIENT CLASS**  Functional Assessment Tool Used Clinical Judgement  Functional Limitation Mobility: Walking and moving around  Mobility: Walking and Moving Around Current Status (M2500) CJ  Mobility: Walking and Moving Around Goal Status (B7048) CI  PT General Charges  $$ ACUTE PT VISIT 1 Procedure  PT Evaluation  $Initial PT Evaluation Tier I 1 Procedure  PT Treatments  $Therapeutic Activity 8-22 mins    Assessment: Pt admitted with above diagnosis. Pt currently with functional limitations due to the deficits listed below (see PT Problem List).  Min assist to maintain balance during sit>stand and stand pivot transfers.  Did not attempt using knee walker this session 2/2 pt's fatigue and unsteadiness w/ stand  pivot transfer.  OT worked w/ pt following PT session and per OT pt was able to ambulate to bathroom (see OT note).  Pt lives alone and will have intermittent assist from brother and mother (mother unable to provide physical assist).  Pt has two steps to enter home and will need a WC. Per pt, brother can assist pt inside home bumping pt up in [facility].  Pt's mother will be available prn to assist w/ ADLs. Communicated w/ CM and RN about pt's needs prior to d/c home.  Pt will benefit from skilled PT to increase their independence and safety with mobility to allow discharge to the venue listed above.   Joslyn Hy PT, DPT [phone] Pager: [phone]

## 2015-01-27 ENCOUNTER — Encounter (HOSPITAL_COMMUNITY): Payer: Self-pay | Admitting: *Deleted

## 2015-01-27 DIAGNOSIS — Z79899 Other long term (current) drug therapy: Secondary | ICD-10-CM

## 2015-01-27 DIAGNOSIS — I10 Essential (primary) hypertension: Secondary | ICD-10-CM

## 2015-01-27 DIAGNOSIS — E119 Type 2 diabetes mellitus without complications: Secondary | ICD-10-CM

## 2015-01-27 DIAGNOSIS — E785 Hyperlipidemia, unspecified: Secondary | ICD-10-CM

## 2015-01-27 DIAGNOSIS — S82851A Displaced trimalleolar fracture of right lower leg, initial encounter for closed fracture: Secondary | ICD-10-CM | POA: Diagnosis not present

## 2015-01-27 DIAGNOSIS — Z7951 Long term (current) use of inhaled steroids: Secondary | ICD-10-CM

## 2015-01-27 DIAGNOSIS — X58XXXA Exposure to other specified factors, initial encounter: Secondary | ICD-10-CM | POA: Diagnosis not present

## 2015-01-27 DIAGNOSIS — S92352A Displaced fracture of fifth metatarsal bone, left foot, initial encounter for closed fracture: Secondary | ICD-10-CM

## 2015-01-27 DIAGNOSIS — J45909 Unspecified asthma, uncomplicated: Secondary | ICD-10-CM

## 2015-01-27 DIAGNOSIS — Z794 Long term (current) use of insulin: Secondary | ICD-10-CM

## 2015-01-27 LAB — GLUCOSE, CAPILLARY
Glucose-Capillary: 113 mg/dL — ABNORMAL HIGH (ref 65–99)
Glucose-Capillary: 127 mg/dL — ABNORMAL HIGH (ref 65–99)

## 2015-01-27 LAB — HEMOGLOBIN A1C
Hgb A1c MFr Bld: 7.2 % — ABNORMAL HIGH (ref 4.8–5.6)
Mean Plasma Glucose: 160 mg/dL

## 2015-01-27 NOTE — Discharge Summary (Signed)
Physician Discharge Summary  Patient ID: Mary Choi MRN: [id number] DOB/AGE: 12-20-1950 64 y.o.  Admit date: 01/25/2015 Discharge date: 01/27/2015  Admission Diagnoses:  <principal problem not specified>  Discharge Diagnoses:  Active Problems:   Diabetes   Ankle fracture   HTN (hypertension)   HLD (hyperlipidemia)   Asthma   Dislocation   Past Medical History  Diagnosis Date  . Allergy   . Cancer   . Asthma   . Diabetes mellitus without complication   . Hypertension     Surgeries: Procedure(s): OPEN REDUCTION INTERNAL FIXATION (ORIF) ANKLE FRACTURE on 01/31/2015   Consultants (if any): Treatment Team:  Renette Butters, MD  Discharged Condition: Improved  Hospital Course: Mary Choi is an 64 y.o. female who was admitted 01/25/2015 with a diagnosis of <principal problem not specified> and went to the operating room on 01/31/2015 and underwent the above named procedures.    She was given perioperative antibiotics:  Anti-infectives    None    .  She was given sequential compression devices, early ambulation, and ASA 325mg  for DVT prophylaxis.  She benefited maximally from the hospital stay and there were no complications.    Recent vital signs:  Filed Vitals:   01/27/15 0600  BP: 138/88  Pulse: 79  Temp: 97.8 F (36.6 C)  Resp: 18    Recent laboratory studies:  Lab Results  Component Value Date   HGB 11.7* 01/25/2015   HGB 12.9 08/26/2009   HGB 11.5* 08/26/2009   Lab Results  Component Value Date   WBC 8.4 01/25/2015   PLT 222 01/25/2015   No results found for: INR Lab Results  Component Value Date   NA 138 01/25/2015   K 4.4 01/25/2015   CL 101 01/25/2015   CO2 28 01/25/2015   BUN 15 01/25/2015   CREATININE 0.89 01/25/2015   GLUCOSE 193* 01/25/2015    Discharge Medications:     Medication List    STOP taking these medications        acetaminophen 500 MG tablet  Commonly known as:  TYLENOL     meloxicam 7.5 MG tablet  Commonly  known as:  MOBIC      TAKE these medications        albuterol 108 (90 BASE) MCG/ACT inhaler  Commonly known as:  PROVENTIL HFA;VENTOLIN HFA  Inhale 2 puffs into the lungs every 6 (six) hours as needed for wheezing.     amLODipine 10 MG tablet  Commonly known as:  NORVASC  Take 10 mg by mouth daily.     aspirin 325 MG tablet  Take 1 tablet (325 mg total) by mouth daily.     cyanocobalamin 2000 MCG tablet  Take 2,000 mcg by mouth daily.     cyclobenzaprine 5 MG tablet  Commonly known as:  FLEXERIL  Take 1 tablet (5 mg total) by mouth 2 (two) times daily as needed for muscle spasms.     docusate sodium 100 MG capsule  Commonly known as:  COLACE  Take 1 capsule (100 mg total) by mouth 2 (two) times daily.     losartan-hydrochlorothiazide 100-25 MG per tablet  Commonly known as:  HYZAAR  Take 1 tablet by mouth daily.     metFORMIN 1000 MG tablet  Commonly known as:  GLUCOPHAGE  Take 1,000 mg by mouth at bedtime.     ondansetron 4 MG tablet  Commonly known as:  ZOFRAN  Take 1 tablet (4 mg total) by mouth every 8 (  eight) hours as needed for nausea or vomiting.     oxyCODONE-acetaminophen 5-325 MG per tablet  Commonly known as:  PERCOCET  Take 1-2 tablets by mouth every 4 (four) hours as needed for severe pain.     pioglitazone 15 MG tablet  Commonly known as:  ACTOS  Take 15 mg by mouth at bedtime.     rosuvastatin 10 MG tablet  Commonly known as:  CRESTOR  Take 10 mg by mouth daily.     TOUJEO SOLOSTAR 300 UNIT/ML Sopn  Generic drug:  Insulin Glargine  Inject 23 Units into the skin at bedtime.        Diagnostic Studies: Dg Tibia/fibula Right  01/25/2015   CLINICAL DATA:  Right ankle pain, deformity, fall down steps today  EXAM: RIGHT TIBIA AND FIBULA - 2 VIEW  COMPARISON:  Right ankle same day  FINDINGS: Four views of the right tibia-fibula submitted. There is displaced fracture in the distal right tibia and fibula with disruption of ankle mortise and dislocation  of the ankle. Displaced fracture of medial tibial malleolus. There is medial displacement of distal tibia. Anteromedial displacement of distal shaft of right fibula. Plantar and posterior spurring of calcaneus. No proximal fibular or tibial fracture. Two metallic screws are noted in right patella.  IMPRESSION: Displaced fracture of distal right tibia and fibula with ankle dislocation and medial displacement of distal tibia. Anteromedial displacement of distal shaft of right fibula.   Electronically Signed   By: Lahoma Crocker M.D.   On: 01/25/2015 15:43   Dg Ankle 2 Views Right  01/25/2015   CLINICAL DATA:  RIGHT ankle pain and deformity. Fall down steps today. Initial encounter.  EXAM: RIGHT ANKLE - 2 VIEW  COMPARISON:  None.  FINDINGS: Fracture dislocation of the RIGHT ankle is present. The talus is laterally dislocated. Displaced medial malleolus fracture. Distal fibular metaphysis fracture. Talus is also posteriorly disc located relative to the tibial plafond. The fracture appears to be a try malleolar fracture. Calcaneal spurs are incidentally noted. Apex medial angulation of the fracture components with valgus deformity of the ankle.  IMPRESSION: Trimalleolar RIGHT ankle fracture dislocation.   Electronically Signed   By: Dereck Ligas M.D.   On: 01/25/2015 15:39   Dg Ankle Complete Left  01/25/2015   CLINICAL DATA:  LEFT ankle pain and deformity. Fall down steps today. Initial encounter.  EXAM: LEFT ANKLE COMPLETE - 3+ VIEW; LEFT FOOT - COMPLETE 3+ VIEW  COMPARISON:  None.  FINDINGS: LEFT ankle: The ankle mortise is congruent. The talar dome appears intact. Tibia and fibula appear normal. On the lateral projection, there is a fifth metatarsal base fracture, probably representing a pseudo Jones fracture.  LEFT foot: The alignment of the LEFT foot is within normal limits. Pseudo Jones fracture of the LEFT fifth metatarsal base is present. Fracture is minimally displaced, with distraction of about 2 mm. The  fracture extends intra-articular at the fifth tarsometatarsal junction. No other fractures are identified.  IMPRESSION: 1. Intact LEFT ankle. 2. Pseudo Jones fracture of the fifth metatarsal base.   Electronically Signed   By: Dereck Ligas M.D.   On: 01/25/2015 15:43   Dg Ankle Complete Right  01/25/2015   CLINICAL DATA:  64 year old female with distal tibial and fibular fracture status post cast placement. Follow up study.  EXAM: RIGHT ANKLE - COMPLETE 3+ VIEW  COMPARISON:  Radiograph dated 01/25/2015.  FINDINGS: Evaluation of the study is limited due to overlying cast.  Oblique fracture of the  distal fibula with approximately 4 mm lateral displacement of the distal fracture fragment similar to prior study. There is nondisplaced fracture of the medial malleolus. There is anatomic alignment of the talotibial articulation.  IMPRESSION: Fractures of the distal fibula and medial malleolus.  Anatomic alignment of the talotibial articulation.   Electronically Signed   By: Anner Crete M.D.   On: 01/25/2015 19:07   Dg Ankle Right Port  01/25/2015   CLINICAL DATA:  Postreduction trimalleolar right ankle fracture.  EXAM: PORTABLE RIGHT ANKLE - 2 VIEW  COMPARISON:  Radiographs same date.  FINDINGS: 1645 hours. The ankle has been splinted. There is improved alignment of the trimalleolar fracture with mild residual posterior subluxation of the talar dome with respect to the tibial plafond.  IMPRESSION: Improved alignment of the trimalleolar fracture with mild residual posterior subluxation of the talus.   Electronically Signed   By: Richardean Sale M.D.   On: 01/25/2015 17:00   Dg Foot Complete Left  01/25/2015   CLINICAL DATA:  LEFT ankle pain and deformity. Fall down steps today. Initial encounter.  EXAM: LEFT ANKLE COMPLETE - 3+ VIEW; LEFT FOOT - COMPLETE 3+ VIEW  COMPARISON:  None.  FINDINGS: LEFT ankle: The ankle mortise is congruent. The talar dome appears intact. Tibia and fibula appear normal. On the  lateral projection, there is a fifth metatarsal base fracture, probably representing a pseudo Jones fracture.  LEFT foot: The alignment of the LEFT foot is within normal limits. Pseudo Jones fracture of the LEFT fifth metatarsal base is present. Fracture is minimally displaced, with distraction of about 2 mm. The fracture extends intra-articular at the fifth tarsometatarsal junction. No other fractures are identified.  IMPRESSION: 1. Intact LEFT ankle. 2. Pseudo Jones fracture of the fifth metatarsal base.   Electronically Signed   By: Dereck Ligas M.D.   On: 01/25/2015 15:43   Dg Toe 3rd Right  01/23/2015   CLINICAL DATA:  [place] down stairs.  Injured right third toe.  EXAM: RIGHT THIRD TOE  COMPARISON:  None.  FINDINGS: No acute bony abnormality. Specifically, no fracture, subluxation, or dislocation. Soft tissues are intact.  IMPRESSION: Negative.   Electronically Signed   By: Rolm Baptise M.D.   On: 01/23/2015 15:02    Disposition: 01-Home or Self Care      Discharge Instructions    Face-to-face encounter (required for Medicare/Medicaid patients)    Complete by:  As directed   I Gabrella Stroh [name] certify that this patient is under my care and that I, or a nurse practitioner or physician's assistant working with me, had a face-to-face encounter that meets the physician face-to-face encounter requirements with this patient on 01/26/2015. The encounter with the patient was in whole, or in part for the following medical condition(s) which is the primary reason for home health care (List medical condition): right ankle fracture  The encounter with the patient was in whole, or in part, for the following medical condition, which is the primary reason for home health care:  Right ankle fracture  I certify that, based on my findings, the following services are medically necessary home health services:  Physical therapy  Reason for Medically Necessary Home Health Services:  Therapy- Personnel officer,  Public librarian  My clinical findings support the need for the above services:  Pain interferes with ambulation/mobility  Further, I certify that my clinical findings support that this patient is homebound due to:  Pain interferes with ambulation/mobility     Home  Health    Complete by:  As directed   To provide the following care/treatments:   PT OT       Non weight bearing    Complete by:  As directed   Laterality:  left  Extremity:  Lower     Weight bearing as tolerated    Complete by:  As directed   Laterality:  right  Extremity:  Lower  In CAM boot at all times     Wheelchair    Complete by:  As directed            Follow-up Information    Follow up with MURPHY, TIMOTHY D, MD In 1 day.   Specialty:  Orthopedic Surgery   Contact information:   [address] [id number] [phone]       Follow up with [facility].   Why:  They will contact you to schedule home therapy visits.   Contact information:   [address] [place] [address] [phone]        Signed: Gae Dry 01/27/2015, 7:26 AM Cell [phone]

## 2015-01-27 NOTE — Progress Notes (Signed)
Patient seen and examined. Case d/w residents in detail. I agree with findings and plan as documented in Dr. Ronnald Ramp' note.  Patient feels well today with only mild pain in her R foot. Lungs are CTA b/l, R LE is wrapped, No murmurs on cardiac exam  Assessment and Plan: 64 y/o female with R ankle fx. She is scheduled for surgery next week. Ok to be dc'd home today per ortho.  CBGs are well controlled on current regimen and BP is at goal as well. D/c home on home meds.

## 2015-01-27 NOTE — Progress Notes (Signed)
Physical Therapy Treatment Patient Details Name: Mary Choi MRN: [id number] DOB: 1951-03-09 Today's Date: 01/27/2015    History of Present Illness Pt is a 64 y/o F s/p fall down stairs now w/ L trimalleolar fx and R 5th metatarsal fx.  Plan is for surgery on Tuesday, 01/31/15.  Pt's PMH includes cancer (lymphedema RUE), asthma, DM, HTN.    PT Comments    Pt unable to achieve stair training using RW backwards but pt successfully demonstrated ability to bump up and down steps on her buttocks w/ use of R rail and will be using this technique upon d/c home.  Pt supplied w/ information on purchasing or renting knee walker and pt demonstrated safe use of knee walker this session.  Pt will benefit from continued skilled PT services to increase functional independence and safety.   Follow Up Recommendations  Home health PT;Supervision for mobility/OOB     Equipment Recommendations  Rolling walker with 5" wheels;3in1 (PT)    Recommendations for Other Services       Precautions / Restrictions Precautions Precautions: Fall Precaution Comments: s/p fall down stairs Required Braces or Orthoses: Other Brace/Splint Other Brace/Splint: L CAM boot Restrictions Weight Bearing Restrictions: Yes RLE Weight Bearing: Non weight bearing LLE Weight Bearing: Weight bearing as tolerated    Mobility  Bed Mobility               General bed mobility comments: in recliner  Transfers Overall transfer level: Needs assistance Equipment used: Rolling walker (2 wheeled) Transfers: Sit to/from [facility] Sit to Stand: Supervision Stand pivot transfers: Supervision       General transfer comment: Supervision for safety.  Pt remembered to tuck L foot under to push up to standing.  Increased time.  Demonstration and VCs for proper technique for stand pivot transfer to knee walker which pt was able to demonstrate w/ supervision.  Ambulation/Gait Ambulation/Gait assistance: Min  guard Ambulation Distance (Feet): 80 Feet Assistive device: Rolling walker (2 wheeled) (knee walker) Gait Pattern/deviations: Step-to pattern;Antalgic   Gait velocity interpretation: Below normal speed for age/gender General Gait Details: 40 ft using RW w/ increased fatigue toward end of ambulation.  Used knee walker and safely demonstrated use of all safety features of knee walker.   Stairs Stairs: Yes Stairs assistance: Min assist Stair Management: No rails;One rail Right;Step to pattern;Seated/boosting;With walker;Backwards Number of Stairs: 3 General stair comments: First attempt backward w/ RW, pt was able to successfully hop onto 1st step but unable to hop up to 2nd step.  Demonstration and VCs for bumping up backwards on bottom and pt demosntrated w/ min assist during descent and ascent from sit<>stand.  Educated pt on use of step stool/platform to be placed at top of steps at home w/ chair to bump up to after step stool and pt verbalized understanding using teachback.  Wheelchair Mobility    Modified Rankin (Stroke Patients Only)       Balance Overall balance assessment: Needs assistance Sitting-balance support: No upper extremity supported;Feet supported Sitting balance-Leahy Scale: Good     Standing balance support: Bilateral upper extremity supported;During functional activity Standing balance-Leahy Scale: Fair                      Cognition Arousal/Alertness: Awake/alert Behavior During Therapy: WFL for tasks assessed/performed Overall Cognitive Status: Within Functional Limits for tasks assessed  Exercises      General Comments General comments (skin integrity, edema, etc.): Pt has changed her plans and will be going to her mother's home upon d/c that has 2 steps to get into home w/ B rails and 3 steps to get up to bedroom/bathroom w/ R rail.      Pertinent Vitals/Pain Pain Assessment: 0-10 Pain Score: 2  Pain Location:  RLE Pain Descriptors / Indicators: Aching Pain Intervention(s): Limited activity within patient's tolerance;Monitored during session;Repositioned    Home Living Family/patient expects to be discharged to:: Private residence   Available Help at Discharge: Family         Home Equipment: Gilford Rile - 4 wheels;Walker - 2 wheels;Crutches      Prior Function            PT Goals (current goals can now be found in the care plan section) Acute Rehab PT Goals Patient Stated Goal: to be able to go home safely PT Goal Formulation: With patient Time For Goal Achievement: 02/02/15 Potential to Achieve Goals: Good Progress towards PT goals: Progressing toward goals    Frequency  Min 5X/week    PT Plan Current plan remains appropriate    Co-evaluation             End of Session Equipment Utilized During Treatment: Gait belt;Other (comment) (L CAM boot) Activity Tolerance: Patient tolerated treatment well;Patient limited by fatigue Patient left: in chair;with call bell/phone within reach     Time: 5374-8270 PT Time Calculation (min) (ACUTE ONLY): 46 min  Charges:  $Gait Training: 38-52 mins                    G Codes:      Joslyn Hy PT, [place] [phone] Pager: [phone] 01/27/2015, 2:05 PM

## 2015-01-27 NOTE — Progress Notes (Signed)
PAT call done for surgery 01/31/15.  BMET and EKG done at [facility] 01/25/15.

## 2015-01-27 NOTE — Progress Notes (Signed)
     Subjective:  S/P closed reduction of L right ankle.  Patient reports pain as mild to moderate.  Up to the bathroom with min assistance this morning with a walker.  Will have her try a knee walker this morning with PT to see if she prefers this.  Will plan for d/c this afternoon and to have her touch base with our surgery schedulers today to set up outpatient surgery Tuesday.   Objective:   VITALS:   Filed Vitals:   01/26/15 0604 01/26/15 1614 01/26/15 2004 01/27/15 0600  BP: 107/68 142/83 114/75 138/88  Pulse: 73 101 87 79  Temp: 98.2 F (36.8 C) 98.2 F (36.8 C) 98.6 F (37 C) 97.8 F (36.6 C)  TempSrc:  Oral Oral   Resp: 15 14 16 18   Weight:      SpO2: 99% 99% 99% 100%    Neurologically intact ABD soft Neurovascular intact Sensation intact distally Intact pulses distally L leg in CAM boot, R leg splinted  Lab Results  Component Value Date   WBC 8.4 01/25/2015   HGB 11.7* 01/25/2015   HCT 36.9 01/25/2015   MCV 92.0 01/25/2015   PLT 222 01/25/2015   BMET    Component Value Date/Time   NA 138 01/25/2015 1548   K 4.4 01/25/2015 1548   CL 101 01/25/2015 1548   CO2 28 01/25/2015 1548   GLUCOSE 193* 01/25/2015 1548   BUN 15 01/25/2015 1548   CREATININE 0.89 01/25/2015 1548   CALCIUM 9.3 01/25/2015 1548   GFRNONAA >60 01/25/2015 1548   GFRAA >60 01/25/2015 1548     Assessment/Plan:     Active Problems:   Diabetes   Ankle fracture   HTN (hypertension)   HLD (hyperlipidemia)   Asthma   Dislocation   Up with therapy NWB in the RLE, WBAT in the CAM boot in the LLE ASA 325mg  daily for DVT prophylaxis Plan to d/c today with home health.  Will have outpatient surgery on right ankle early next week.    Mary Choi Mary Choi 01/27/2015, 7:07 AM Cell [phone]

## 2015-01-27 NOTE — Progress Notes (Signed)
Subjective: Doing well this AM, only mild pain.    Objective: Vital signs in last 24 hours: Filed Vitals:   01/26/15 0604 01/26/15 1614 01/26/15 2004 01/27/15 0600  BP: 107/68 142/83 114/75 138/88  Pulse: 73 101 87 79  Temp: 98.2 F (36.8 C) 98.2 F (36.8 C) 98.6 F (37 C) 97.8 F (36.6 C)  TempSrc:  Oral Oral   Resp: 15 14 16 18   Weight:      SpO2: 99% 99% 99% 100%   Weight change:   Intake/Output Summary (Last 24 hours) at 01/27/15 1506 Last data filed at 01/27/15 0522  Gross per 24 hour  Intake 2583.75 ml  Output      0 ml  Net 2583.75 ml   Physical Exam: General: AA female, alert, cooperative, NAD. HEENT: PERRL, EOMI. Moist mucus membranes Neck: Full range of motion without pain, supple, no lymphadenopathy or carotid bruits Lungs: Clear to ascultation bilaterally, normal work of respiration, no wheezes, rales, rhonchi Heart: RRR, no murmurs, gallops, or rubs Abdomen: Soft, non-tender, non-distended, BS + Extremities: No cyanosis or clubbing. Bilateral splint/cast. Neurologic: Alert & oriented x3, cranial nerves II-XII intact, strength grossly intact, sensation intact to light touch    Lab Results: Basic Metabolic Panel:  Recent Labs Lab 01/25/15 1548  NA 138  K 4.4  CL 101  CO2 28  GLUCOSE 193*  BUN 15  CREATININE 0.89  CALCIUM 9.3     Recent Labs Lab 01/25/15 1548  WBC 8.4  NEUTROABS 6.5  HGB 11.7*  HCT 36.9  MCV 92.0  PLT 222   CBG:  Recent Labs Lab 01/26/15 0757 01/26/15 1123 01/26/15 1733 01/26/15 2158 01/27/15 0654 01/27/15 1146  GLUCAP 154* 99 138* 208* 113* 127*    Studies/Results: Dg Tibia/fibula Right  01/25/2015   CLINICAL DATA:  Right ankle pain, deformity, fall down steps today  EXAM: RIGHT TIBIA AND FIBULA - 2 VIEW  COMPARISON:  Right ankle same day  FINDINGS: Four views of the right tibia-fibula submitted. There is displaced fracture in the distal right tibia and fibula with disruption of ankle mortise and  dislocation of the ankle. Displaced fracture of medial tibial malleolus. There is medial displacement of distal tibia. Anteromedial displacement of distal shaft of right fibula. Plantar and posterior spurring of calcaneus. No proximal fibular or tibial fracture. Two metallic screws are noted in right patella.  IMPRESSION: Displaced fracture of distal right tibia and fibula with ankle dislocation and medial displacement of distal tibia. Anteromedial displacement of distal shaft of right fibula.   Electronically Signed   By: Lahoma Crocker M.D.   On: 01/25/2015 15:43   Dg Ankle 2 Views Right  01/25/2015   CLINICAL DATA:  RIGHT ankle pain and deformity. Fall down steps today. Initial encounter.  EXAM: RIGHT ANKLE - 2 VIEW  COMPARISON:  None.  FINDINGS: Fracture dislocation of the RIGHT ankle is present. The talus is laterally dislocated. Displaced medial malleolus fracture. Distal fibular metaphysis fracture. Talus is also posteriorly disc located relative to the tibial plafond. The fracture appears to be a try malleolar fracture. Calcaneal spurs are incidentally noted. Apex medial angulation of the fracture components with valgus deformity of the ankle.  IMPRESSION: Trimalleolar RIGHT ankle fracture dislocation.   Electronically Signed   By: Dereck Ligas M.D.   On: 01/25/2015 15:39   Dg Ankle Complete Left  01/25/2015   CLINICAL DATA:  LEFT ankle pain and deformity. Fall down steps today. Initial encounter.  EXAM: LEFT ANKLE COMPLETE -  3+ VIEW; LEFT FOOT - COMPLETE 3+ VIEW  COMPARISON:  None.  FINDINGS: LEFT ankle: The ankle mortise is congruent. The talar dome appears intact. Tibia and fibula appear normal. On the lateral projection, there is a fifth metatarsal base fracture, probably representing a pseudo Rogene Meth fracture.  LEFT foot: The alignment of the LEFT foot is within normal limits. Pseudo Kiosha Buchan fracture of the LEFT fifth metatarsal base is present. Fracture is minimally displaced, with distraction of  about 2 mm. The fracture extends intra-articular at the fifth tarsometatarsal junction. No other fractures are identified.  IMPRESSION: 1. Intact LEFT ankle. 2. Pseudo Avelino Herren fracture of the fifth metatarsal base.   Electronically Signed   By: Dereck Ligas M.D.   On: 01/25/2015 15:43   Dg Ankle Complete Right  01/25/2015   CLINICAL DATA:  64 year old female with distal tibial and fibular fracture status post cast placement. Follow up study.  EXAM: RIGHT ANKLE - COMPLETE 3+ VIEW  COMPARISON:  Radiograph dated 01/25/2015.  FINDINGS: Evaluation of the study is limited due to overlying cast.  Oblique fracture of the distal fibula with approximately 4 mm lateral displacement of the distal fracture fragment similar to prior study. There is nondisplaced fracture of the medial malleolus. There is anatomic alignment of the talotibial articulation.  IMPRESSION: Fractures of the distal fibula and medial malleolus.  Anatomic alignment of the talotibial articulation.   Electronically Signed   By: Anner Crete M.D.   On: 01/25/2015 19:07   Dg Ankle Right Port  01/25/2015   CLINICAL DATA:  Postreduction trimalleolar right ankle fracture.  EXAM: PORTABLE RIGHT ANKLE - 2 VIEW  COMPARISON:  Radiographs same date.  FINDINGS: 1645 hours. The ankle has been splinted. There is improved alignment of the trimalleolar fracture with mild residual posterior subluxation of the talar dome with respect to the tibial plafond.  IMPRESSION: Improved alignment of the trimalleolar fracture with mild residual posterior subluxation of the talus.   Electronically Signed   By: Richardean Sale M.D.   On: 01/25/2015 17:00   Dg Foot Complete Left  01/25/2015   CLINICAL DATA:  LEFT ankle pain and deformity. Fall down steps today. Initial encounter.  EXAM: LEFT ANKLE COMPLETE - 3+ VIEW; LEFT FOOT - COMPLETE 3+ VIEW  COMPARISON:  None.  FINDINGS: LEFT ankle: The ankle mortise is congruent. The talar dome appears intact. Tibia and fibula appear  normal. On the lateral projection, there is a fifth metatarsal base fracture, probably representing a pseudo Ema Hebner fracture.  LEFT foot: The alignment of the LEFT foot is within normal limits. Pseudo Ebonye Reade fracture of the LEFT fifth metatarsal base is present. Fracture is minimally displaced, with distraction of about 2 mm. The fracture extends intra-articular at the fifth tarsometatarsal junction. No other fractures are identified.  IMPRESSION: 1. Intact LEFT ankle. 2. Pseudo Hadleigh Felber fracture of the fifth metatarsal base.   Electronically Signed   By: Dereck Ligas M.D.   On: 01/25/2015 15:43   Medications: I have reviewed the patient's current medications. Scheduled Meds: . amLODipine  10 mg Oral Daily  . aspirin EC  325 mg Oral Daily  . docusate sodium  100 mg Oral BID  . hydrochlorothiazide  25 mg Oral Daily  . insulin aspart  0-5 Units Subcutaneous QHS  . insulin aspart  0-9 Units Subcutaneous TID WC  . insulin aspart  3 Units Subcutaneous TID WC  . insulin glargine  12 Units Subcutaneous QHS  . losartan  100 mg Oral Daily  .  rosuvastatin  10 mg Oral Daily  . cyanocobalamin  2,000 mcg Oral Daily   Continuous Infusions:   PRN Meds:.acetaminophen, albuterol, cyclobenzaprine, HYDROcodone-acetaminophen, morphine injection   Assessment/Plan:  Trimalleolar right ankle fx and pseudo Jeovani Weisenburger left 5th metatarsal fracture - management per ortho. Plan for surgery next week with Dr. Percell Miller.  DM II - hgba1c last was <7 per patient's report. On Toujeo 23 units + metformin 1000mg  + actos 15 mg - CBGs has been well controlled under 200 with Lantus 12 units + SSI-sensitive + meal time. - continue home meds on discharge. Needs to follow up with PCP for chronic diabetic management. May consider lowering Toujeo dose to 15 units with her hypoglycemia. 23units may be too high for her. - f/up hgba1c.  HTN - cont losartan-hctz and amlodipine  HLD - cont crestor 10mg  daily  Asthma - cont prn  albuterol  dvt ppx: asa 325mg  + scd per ortho  Ok for discharge from our side.  Low risk for fixation procedure of her fractures.    Dispo: Disposition is deferred at this time, awaiting improvement of current medical problems.  Anticipated discharge in approximately today(s).   The patient does have a current PCP (No Pcp Per Patient) and does need an [facility] hospital follow-up appointment after discharge.  The patient does have transportation limitations that hinder transportation to clinic appointments.  .Services Needed at time of discharge: Y = Yes, Blank = No PT:   OT:   RN:   Equipment:   Other:     LOS: 2 days   Corky Sox, MD 01/27/2015, 3:06 PM

## 2015-01-27 NOTE — Progress Notes (Signed)
Occupational Therapy Treatment Patient Details Name: Mary Choi MRN: [id number] DOB: 15-Feb-1951 Today's Date: 01/27/2015    History of present illness Pt is a 64 y/o F s/p fall down stairs now w/ L trimalleolar fx and R 5th metatarsal fx.  Plan is for surgery on Tuesday, 01/31/15.  Pt's PMH includes cancer (lymphedema RUE), asthma, DM, HTN.   OT comments  Completed all education. Acute OT goals met. Pt safe to D/C home but needs a 3 in 1. Will need to continue with HHOT.  Follow Up Recommendations  Home health OT;Supervision - Intermittent;Other (comment)    Equipment Recommendations  3 in 1 bedside comode    Recommendations for Other Services      Precautions / Restrictions Precautions Precautions: Fall Required Braces or Orthoses: Other Brace/Splint Other Brace/Splint: L CAM boot Restrictions RLE Weight Bearing: Non weight bearing LLE Weight Bearing: Weight bearing as tolerated       Mobility Bed Mobility                  Transfers Overall transfer level: Needs assistance Equipment used: Rolling walker (2 wheeled) Transfers: Sit to/from [facility] Sit to Stand: Supervision Stand pivot transfers: Supervision       General transfer comment: improvement from yesterday's session. Pt more confident.    Balance     Sitting balance-Leahy Scale: Good       Standing balance-Leahy Scale: Fair                     ADL       Grooming: Supervision/safety;Standing (educated on needing to have 1 hand supported for safety)   Upper Body Bathing: Set up   Lower Body Bathing: Set up   Upper Body Dressing : Set up   Lower Body Dressing: Set up   Toilet Transfer: Supervision/safety   Toileting- Clothing Manipulation and Hygiene: Modified independent       Functional mobility during ADLs: Supervision/safety General ADL Comments: Completed educqation regarding copensatory techniques, use of 3 in 1 for toilet transfers and bathing;  reducing risk of falls during ADL; home safety and proper set up  Educated pt on donning/doffing L cam boot. Pt able to return demonstrate.       Vision                     Perception     Praxis      Cognition   Behavior During Therapy: WFL for tasks assessed/performed Overall Cognitive Status: Within Functional Limits for tasks assessed                       Extremity/Trunk Assessment               Exercises     Shoulder Instructions       General Comments      Pertinent Vitals/ Pain       Pain Assessment: 0-10 Pain Score: 3  Pain Location: B ankles Pain Descriptors / Indicators: Aching;Discomfort Pain Intervention(s): Limited activity within patient's tolerance;Repositioned  Home Living Family/patient expects to be discharged to:: Private residence   Available Help at Discharge: Family                         Home Equipment: Gilford Rile - 4 wheels;Walker - 2 wheels;Crutches          Prior Functioning/Environment  Frequency Min 3X/week     Progress Toward Goals  OT Goals(current goals can now be found in the care plan section)  Progress towards OT goals: Goals met/education completed, patient discharged from OT (Pt to continue with [place])  Acute Rehab OT Goals Patient Stated Goal: to be able to go home safely OT Goal Formulation: With patient Time For Goal Achievement: 02/02/15 Potential to Achieve Goals: Good ADL Goals Pt Will Perform Lower Body Bathing: with supervision;with set-up;sit to/from stand Pt Will Perform Lower Body Dressing: with set-up;with supervision;sit to/from stand Pt Will Transfer to Toilet: with supervision;ambulating;bedside commode Pt Will Perform Toileting - Clothing Manipulation and hygiene: with modified independence;sit to/from stand  Plan Discharge plan remains appropriate    Co-evaluation                 End of Session Equipment Utilized During Treatment: Gait  belt;Rolling walker   Activity Tolerance Patient tolerated treatment well   Patient Left in chair;with call bell/phone within reach   Nurse Communication Mobility status    Functional Assessment Tool Used: clinical judgement Functional Limitation: Self care Self Care Current Status (I7125): At least 1 percent but less than 20 percent impaired, limited or restricted Self Care Goal Status (I7129): At least 1 percent but less than 20 percent impaired, limited or restricted Self Care Discharge Status [phone]): At least 1 percent but less than 20 percent impaired, limited or restricted   Time: 1052-1130 OT Time Calculation (min): 38 min  Charges: OT G-codes **NOT FOR INPATIENT CLASS** Functional Assessment Tool Used: clinical judgement Functional Limitation: Self care Self Care Current Status (B0149): At least 1 percent but less than 20 percent impaired, limited or restricted Self Care Goal Status (P6924): At least 1 percent but less than 20 percent impaired, limited or restricted Self Care Discharge Status [phone]): At least 1 percent but less than 20 percent impaired, limited or restricted OT General Charges $OT Visit: 1 Procedure OT Treatments $Self Care/Home Management : 38-52 mins  Ionia Schey,HILLARY 01/27/2015, 12:21 PM   [facility], OTR/L  [phone] 01/27/2015

## 2015-01-27 NOTE — H&P (Signed)
PREOPERATIVE H&P  Chief Complaint: RIGHT ANKLE FRACTURE  HPI: Mary Choi is a 64 y.o. female who presents for preoperative history and physical with a diagnosis of RIGHT ANKLE FRACTURE. Symptoms are rated as moderate to severe, and have been worsening.  This is significantly impairing activities of daily living.  She has elected for surgical management.   Past Medical History  Diagnosis Date  . Allergy   . Cancer   . Asthma   . Diabetes mellitus without complication   . Hypertension    Past Surgical History  Procedure Laterality Date  . Breast surgery    . Cesarean section    . Abdominal hysterectomy     History   Social History  . Marital Status: Married    Spouse Name: N/A  . Number of Children: N/A  . Years of Education: N/A   Social History Main Topics  . Smoking status: Never Smoker   . Smokeless tobacco: Not on file  . Alcohol Use: Yes  . Drug Use: No  . Sexual Activity: Not on file   Other Topics Concern  . Not on file   Social History Narrative   Family History  Problem Relation Age of Onset  . Diabetes Mother   . Hypertension Mother    No Known Allergies Prior to Admission medications   Medication Sig Start Date End Date Taking? Authorizing Provider  albuterol (PROVENTIL HFA;VENTOLIN HFA) 108 (90 BASE) MCG/ACT inhaler Inhale 2 puffs into the lungs every 6 (six) hours as needed for wheezing.    Historical Provider, MD  amLODipine (NORVASC) 10 MG tablet Take 10 mg by mouth daily.    Historical Provider, MD  aspirin 325 MG tablet Take 1 tablet (325 mg total) by mouth daily. 01/26/15   Tanyia Grabbe Claiborne Billings, PA-C  cyanocobalamin 2000 MCG tablet Take 2,000 mcg by mouth daily.    Historical Provider, MD  cyclobenzaprine (FLEXERIL) 5 MG tablet Take 1 tablet (5 mg total) by mouth 2 (two) times daily as needed for muscle spasms. Patient not taking: Reported on 01/25/2015 09/26/13   Glendell Docker, NP  docusate sodium (COLACE) 100 MG capsule Take 1 capsule (100 mg  total) by mouth 2 (two) times daily. 01/26/15   Trenia Tennyson Claiborne Billings, PA-C  Insulin Glargine (TOUJEO SOLOSTAR) 300 UNIT/ML SOPN Inject 23 Units into the skin at bedtime.    Historical Provider, MD  losartan-hydrochlorothiazide (HYZAAR) 100-25 MG per tablet Take 1 tablet by mouth daily.    Historical Provider, MD  metFORMIN (GLUCOPHAGE) 1000 MG tablet Take 1,000 mg by mouth at bedtime.     Historical Provider, MD  ondansetron (ZOFRAN) 4 MG tablet Take 1 tablet (4 mg total) by mouth every 8 (eight) hours as needed for nausea or vomiting. 01/26/15   Lovett Calender, PA-C  oxyCODONE-acetaminophen (PERCOCET) 5-325 MG per tablet Take 1-2 tablets by mouth every 4 (four) hours as needed for severe pain. 01/26/15   Laurabeth Yip, PA-C  pioglitazone (ACTOS) 15 MG tablet Take 15 mg by mouth at bedtime.    Historical Provider, MD  rosuvastatin (CRESTOR) 10 MG tablet Take 10 mg by mouth daily.    Historical Provider, MD     Positive ROS: All other systems have been reviewed and were otherwise negative with the exception of those mentioned in the HPI and as above.  Physical Exam: General: Alert, no acute distress Cardiovascular: No pedal edema Respiratory: No cyanosis, no use of accessory musculature GI: No organomegaly, abdomen is soft and non-tender Skin: No lesions in  the area of chief complaint Neurologic: Sensation intact distally Psychiatric: Patient is competent for consent with normal mood and affect Lymphatic: No axillary or cervical lymphadenopathy  MUSCULOSKELETAL: swelling and ecchymosis of the right ankle.  grossly tender to palpation. Sensation intact with 2+ distal pulses.  Assessment: RIGHT ANKLE FRACTURE  Plan: Plan for Procedure(s): OPEN REDUCTION INTERNAL FIXATION (ORIF) ANKLE FRACTURE  The risks benefits and alternatives were discussed with the patient including but not limited to the risks of nonoperative treatment, versus surgical intervention including infection, bleeding, nerve  injury,  blood clots, cardiopulmonary complications, morbidity, mortality, among others, and they were willing to proceed.   Gae Dry, PA-C  01/27/2015 2:36 PM

## 2015-01-31 ENCOUNTER — Ambulatory Visit (HOSPITAL_BASED_OUTPATIENT_CLINIC_OR_DEPARTMENT_OTHER): Payer: Medicare Other | Admitting: Anesthesiology

## 2015-01-31 ENCOUNTER — Ambulatory Visit (HOSPITAL_BASED_OUTPATIENT_CLINIC_OR_DEPARTMENT_OTHER)
Admission: RE | Admit: 2015-01-31 | Discharge: 2015-02-01 | Disposition: A | Discharge status: Home / Self Care | Payer: Medicare Other | Source: Ambulatory Visit | Attending: Orthopedic Surgery | Admitting: Orthopedic Surgery

## 2015-01-31 ENCOUNTER — Encounter (HOSPITAL_BASED_OUTPATIENT_CLINIC_OR_DEPARTMENT_OTHER)
Admission: RE | Disposition: A | Discharge status: Home / Self Care | Payer: Self-pay | Source: Ambulatory Visit | Attending: Orthopedic Surgery

## 2015-01-31 ENCOUNTER — Ambulatory Visit (HOSPITAL_COMMUNITY): Payer: Medicare Other

## 2015-01-31 ENCOUNTER — Encounter (HOSPITAL_BASED_OUTPATIENT_CLINIC_OR_DEPARTMENT_OTHER): Payer: Self-pay

## 2015-01-31 DIAGNOSIS — Z794 Long term (current) use of insulin: Secondary | ICD-10-CM | POA: Diagnosis not present

## 2015-01-31 DIAGNOSIS — J45909 Unspecified asthma, uncomplicated: Secondary | ICD-10-CM | POA: Diagnosis not present

## 2015-01-31 DIAGNOSIS — Z7982 Long term (current) use of aspirin: Secondary | ICD-10-CM | POA: Diagnosis not present

## 2015-01-31 DIAGNOSIS — I10 Essential (primary) hypertension: Secondary | ICD-10-CM | POA: Insufficient documentation

## 2015-01-31 DIAGNOSIS — S82891A Other fracture of right lower leg, initial encounter for closed fracture: Secondary | ICD-10-CM

## 2015-01-31 DIAGNOSIS — X58XXXA Exposure to other specified factors, initial encounter: Secondary | ICD-10-CM | POA: Diagnosis not present

## 2015-01-31 DIAGNOSIS — S82841A Displaced bimalleolar fracture of right lower leg, initial encounter for closed fracture: Secondary | ICD-10-CM | POA: Diagnosis not present

## 2015-01-31 DIAGNOSIS — D649 Anemia, unspecified: Secondary | ICD-10-CM | POA: Insufficient documentation

## 2015-01-31 DIAGNOSIS — E119 Type 2 diabetes mellitus without complications: Secondary | ICD-10-CM | POA: Diagnosis not present

## 2015-01-31 DIAGNOSIS — S82899A Other fracture of unspecified lower leg, initial encounter for closed fracture: Secondary | ICD-10-CM | POA: Diagnosis present

## 2015-01-31 HISTORY — PX: ORIF ANKLE FRACTURE: SHX5408

## 2015-01-31 LAB — GLUCOSE, CAPILLARY
Glucose-Capillary: 141 mg/dL — ABNORMAL HIGH (ref 65–99)
Glucose-Capillary: 118 mg/dL — ABNORMAL HIGH (ref 65–99)

## 2015-01-31 SURGERY — OPEN REDUCTION INTERNAL FIXATION (ORIF) ANKLE FRACTURE
Anesthesia: General | Laterality: Right

## 2015-01-31 MED ORDER — PROPOFOL 10 MG/ML IV BOLUS
INTRAVENOUS | Status: DC | PRN
  Administered 2015-01-31: 200 mg via INTRAVENOUS

## 2015-01-31 MED ORDER — BUPIVACAINE-EPINEPHRINE (PF) 0.5% -1:200000 IJ SOLN
INTRAMUSCULAR | Status: DC | PRN
  Administered 2015-01-31: 25 mL via PERINEURAL

## 2015-01-31 MED ORDER — HYDROMORPHONE HCL 1 MG/ML IJ SOLN: 1.0000 mg | INTRAMUSCULAR | Status: DC | PRN

## 2015-01-31 MED ORDER — LOSARTAN POTASSIUM-HCTZ 100-25 MG PO TABS: 1.0000 | ORAL_TABLET | Freq: Every day | ORAL | Status: DC

## 2015-01-31 MED ORDER — CEFAZOLIN SODIUM-DEXTROSE 2-3 GM-% IV SOLR
INTRAVENOUS | Status: AC
  Filled 2015-01-31: qty 50

## 2015-01-31 MED ORDER — PIOGLITAZONE HCL 15 MG PO TABS: 15.0000 mg | ORAL_TABLET | Freq: Every day | ORAL | Status: DC

## 2015-01-31 MED ORDER — CEFAZOLIN SODIUM 1-5 GM-% IV SOLN
1.0000 g | Freq: Four times a day (QID) | INTRAVENOUS | Status: AC
  Administered 2015-01-31 – 2015-02-01 (×3): 1 g via INTRAVENOUS
  Filled 2015-01-31 (×3): qty 50

## 2015-01-31 MED ORDER — MIDAZOLAM HCL 2 MG/2ML IJ SOLN
INTRAMUSCULAR | Status: AC
  Filled 2015-01-31: qty 2

## 2015-01-31 MED ORDER — HYDROMORPHONE HCL 1 MG/ML IJ SOLN: 0.2500 mg | INTRAMUSCULAR | Status: DC | PRN

## 2015-01-31 MED ORDER — GLYCOPYRROLATE 0.2 MG/ML IJ SOLN: 0.2000 mg | Freq: Once | INTRAMUSCULAR | Status: DC | PRN

## 2015-01-31 MED ORDER — ONDANSETRON HCL 4 MG/2ML IJ SOLN
4.0000 mg | Freq: Four times a day (QID) | INTRAMUSCULAR | Status: DC | PRN
Start: 2015-01-31 — End: 2015-02-01

## 2015-01-31 MED ORDER — SODIUM CHLORIDE 0.9 % IV SOLN
INTRAVENOUS | Status: DC
Start: 2015-01-31 — End: 2015-02-01
  Administered 2015-01-31 (×2): via INTRAVENOUS

## 2015-01-31 MED ORDER — FENTANYL CITRATE (PF) 100 MCG/2ML IJ SOLN
INTRAMUSCULAR | Status: AC
Start: 2015-01-31 — End: 2015-01-31
  Filled 2015-01-31: qty 2

## 2015-01-31 MED ORDER — ACETAMINOPHEN 650 MG RE SUPP: 650.0000 mg | Freq: Four times a day (QID) | RECTAL | Status: DC | PRN

## 2015-01-31 MED ORDER — LACTATED RINGERS IV SOLN
INTRAVENOUS | Status: DC
  Administered 2015-01-31 (×2): via INTRAVENOUS

## 2015-01-31 MED ORDER — AMLODIPINE BESYLATE 10 MG PO TABS: 10.0000 mg | ORAL_TABLET | Freq: Every day | ORAL | Status: DC

## 2015-01-31 MED ORDER — METFORMIN HCL 500 MG PO TABS: 1000.0000 mg | ORAL_TABLET | Freq: Every day | ORAL | Status: DC

## 2015-01-31 MED ORDER — OXYCODONE-ACETAMINOPHEN 5-325 MG PO TABS: 1.0000 | ORAL_TABLET | ORAL | Status: DC | PRN

## 2015-01-31 MED ORDER — DEXAMETHASONE SODIUM PHOSPHATE 4 MG/ML IJ SOLN
INTRAMUSCULAR | Status: DC | PRN
  Administered 2015-01-31: 8 mg via INTRAVENOUS

## 2015-01-31 MED ORDER — INSULIN GLARGINE 300 UNIT/ML ~~LOC~~ SOPN: 23.0000 [IU] | PEN_INJECTOR | Freq: Every day | SUBCUTANEOUS | Status: DC

## 2015-01-31 MED ORDER — OXYCODONE HCL 5 MG PO TABS
5.0000 mg | ORAL_TABLET | ORAL | Status: DC | PRN
  Administered 2015-01-31 (×2): 5 mg via ORAL
  Administered 2015-02-01 (×2): 10 mg via ORAL
  Filled 2015-01-31 (×2): qty 2
  Filled 2015-01-31: qty 1
  Filled 2015-01-31: qty 2

## 2015-01-31 MED ORDER — FENTANYL CITRATE (PF) 100 MCG/2ML IJ SOLN
INTRAMUSCULAR | Status: AC
  Filled 2015-01-31: qty 6

## 2015-01-31 MED ORDER — SCOPOLAMINE 1 MG/3DAYS TD PT72: 1.0000 | MEDICATED_PATCH | Freq: Once | TRANSDERMAL | Status: DC | PRN

## 2015-01-31 MED ORDER — CHLORHEXIDINE GLUCONATE 4 % EX LIQD: 60.0000 mL | Freq: Once | CUTANEOUS | Status: DC

## 2015-01-31 MED ORDER — PROMETHAZINE HCL 25 MG/ML IJ SOLN: 6.2500 mg | INTRAMUSCULAR | Status: DC | PRN

## 2015-01-31 MED ORDER — VITAMIN B-12 1000 MCG PO TABS: 2000.0000 ug | ORAL_TABLET | Freq: Every day | ORAL | Status: DC

## 2015-01-31 MED ORDER — ACETAMINOPHEN 500 MG PO TABS
ORAL_TABLET | ORAL | Status: AC
  Filled 2015-01-31: qty 1

## 2015-01-31 MED ORDER — ACETAMINOPHEN 500 MG PO TABS
1000.0000 mg | ORAL_TABLET | Freq: Once | ORAL | Status: AC
  Administered 2015-01-31: 1000 mg via ORAL

## 2015-01-31 MED ORDER — POTASSIUM CHLORIDE IN NACL 20-0.45 MEQ/L-% IV SOLN: INTRAVENOUS | Status: DC

## 2015-01-31 MED ORDER — FENTANYL CITRATE (PF) 100 MCG/2ML IJ SOLN
50.0000 ug | INTRAMUSCULAR | Status: AC | PRN
  Administered 2015-01-31: 25 ug via INTRAVENOUS
  Administered 2015-01-31: 100 ug via INTRAVENOUS
  Administered 2015-01-31: 25 ug via INTRAVENOUS
  Administered 2015-01-31: 50 ug via INTRAVENOUS
  Administered 2015-01-31: 25 ug via INTRAVENOUS

## 2015-01-31 MED ORDER — LIDOCAINE HCL (CARDIAC) 20 MG/ML IV SOLN
INTRAVENOUS | Status: DC | PRN
  Administered 2015-01-31: 50 mg via INTRAVENOUS

## 2015-01-31 MED ORDER — CEFAZOLIN SODIUM-DEXTROSE 2-3 GM-% IV SOLR
2.0000 g | INTRAVENOUS | Status: AC
  Administered 2015-01-31: 2 g via INTRAVENOUS

## 2015-01-31 MED ORDER — ROSUVASTATIN CALCIUM 10 MG PO TABS: 10.0000 mg | ORAL_TABLET | Freq: Every day | ORAL | Status: DC

## 2015-01-31 MED ORDER — METHOCARBAMOL 500 MG PO TABS
500.0000 mg | ORAL_TABLET | Freq: Four times a day (QID) | ORAL | Status: DC | PRN
  Administered 2015-01-31 – 2015-02-01 (×2): 500 mg via ORAL
  Filled 2015-01-31 (×2): qty 1

## 2015-01-31 MED ORDER — ACETAMINOPHEN 325 MG PO TABS: 650.0000 mg | ORAL_TABLET | Freq: Four times a day (QID) | ORAL | Status: DC | PRN

## 2015-01-31 MED ORDER — MIDAZOLAM HCL 2 MG/2ML IJ SOLN
1.0000 mg | INTRAMUSCULAR | Status: DC | PRN
  Administered 2015-01-31: 2 mg via INTRAVENOUS

## 2015-01-31 MED ORDER — ALBUTEROL SULFATE HFA 108 (90 BASE) MCG/ACT IN AERS: 2.0000 | INHALATION_SPRAY | Freq: Four times a day (QID) | RESPIRATORY_TRACT | Status: DC | PRN

## 2015-01-31 MED ORDER — METOCLOPRAMIDE HCL 5 MG PO TABS: 5.0000 mg | ORAL_TABLET | Freq: Three times a day (TID) | ORAL | Status: DC | PRN

## 2015-01-31 MED ORDER — ROPIVACAINE HCL 5 MG/ML IJ SOLN
INTRAMUSCULAR | Status: DC | PRN
  Administered 2015-01-31: 20 mL via PERINEURAL

## 2015-01-31 MED ORDER — DOCUSATE SODIUM 100 MG PO CAPS
100.0000 mg | ORAL_CAPSULE | Freq: Two times a day (BID) | ORAL | Status: DC
Start: 2015-01-31 — End: 2015-02-01
  Administered 2015-01-31: 100 mg via ORAL
  Filled 2015-01-31: qty 1

## 2015-01-31 MED ORDER — METOCLOPRAMIDE HCL 5 MG/ML IJ SOLN: 5.0000 mg | Freq: Three times a day (TID) | INTRAMUSCULAR | Status: DC | PRN

## 2015-01-31 MED ORDER — ONDANSETRON HCL 4 MG PO TABS: 4.0000 mg | ORAL_TABLET | Freq: Four times a day (QID) | ORAL | Status: DC | PRN

## 2015-01-31 MED ORDER — ACETAMINOPHEN 500 MG PO TABS
ORAL_TABLET | ORAL | Status: AC
  Filled 2015-01-31: qty 2

## 2015-01-31 MED ORDER — METHOCARBAMOL 1000 MG/10ML IJ SOLN: 500.0000 mg | Freq: Four times a day (QID) | INTRAVENOUS | Status: DC | PRN

## 2015-01-31 SURGICAL SUPPLY — 75 items
BANDAGE ELASTIC 4 VELCRO ST LF (GAUZE/BANDAGES/DRESSINGS) ×2 IMPLANT
BANDAGE ELASTIC 6 VELCRO ST LF (GAUZE/BANDAGES/DRESSINGS) ×2 IMPLANT
BANDAGE ESMARK 6X9 LF (GAUZE/BANDAGES/DRESSINGS) ×1 IMPLANT
BIT DRILL 2.5X125 (BIT) ×1 IMPLANT
BIT DRILL CANN 2.7 (BIT) ×2
BIT DRILL SRG 2.7XCANN AO CPLG (BIT) ×1 IMPLANT
BIT DRL SRG 2.7XCANN AO CPLNG (BIT) ×1
BLADE SURG 15 STRL LF DISP TIS (BLADE) ×2 IMPLANT
BLADE SURG 15 STRL SS (BLADE) ×4
BNDG CMPR 9X6 STRL LF SNTH (GAUZE/BANDAGES/DRESSINGS) ×1
BNDG COHESIVE 4X5 TAN STRL (GAUZE/BANDAGES/DRESSINGS) ×2 IMPLANT
BNDG ESMARK 6X9 LF (GAUZE/BANDAGES/DRESSINGS) ×2
CHLORAPREP W/TINT 26ML (MISCELLANEOUS) ×2 IMPLANT
CLSR STERI-STRIP ANTIMIC 1/2X4 (GAUZE/BANDAGES/DRESSINGS) ×2 IMPLANT
COVER BACK TABLE 60X90IN (DRAPES) ×2 IMPLANT
CUFF TOURNIQUET SINGLE 24IN (TOURNIQUET CUFF) IMPLANT
CUFF TOURNIQUET SINGLE 34IN LL (TOURNIQUET CUFF) ×1 IMPLANT
DECANTER SPIKE VIAL GLASS SM (MISCELLANEOUS) IMPLANT
DRAPE EXTREMITY T 121X128X90 (DRAPE) ×2 IMPLANT
DRAPE OEC MINIVIEW 54X84 (DRAPES) ×3 IMPLANT
DRAPE U 20/CS (DRAPES) ×2 IMPLANT
DRAPE U-SHAPE 47X51 STRL (DRAPES) ×2 IMPLANT
DRSG EMULSION OIL 3X3 NADH (GAUZE/BANDAGES/DRESSINGS) ×2 IMPLANT
DRSG PAD ABDOMINAL 8X10 ST (GAUZE/BANDAGES/DRESSINGS) ×2 IMPLANT
ELECT REM PT RETURN 9FT ADLT (ELECTROSURGICAL) ×2
ELECTRODE REM PT RTRN 9FT ADLT (ELECTROSURGICAL) ×1 IMPLANT
GAUZE SPONGE 4X4 12PLY STRL (GAUZE/BANDAGES/DRESSINGS) ×2 IMPLANT
GLOVE BIO SURGEON STRL SZ 6.5 (GLOVE) ×3 IMPLANT
GLOVE BIO SURGEON STRL SZ7 (GLOVE) ×2 IMPLANT
GLOVE BIO SURGEON STRL SZ7.5 (GLOVE) ×2 IMPLANT
GLOVE BIO SURGEON STRL SZ8 (GLOVE) IMPLANT
GLOVE BIOGEL PI IND STRL 7.0 (GLOVE) ×3 IMPLANT
GLOVE BIOGEL PI IND STRL 8 (GLOVE) ×1 IMPLANT
GLOVE BIOGEL PI INDICATOR 7.0 (GLOVE) ×3
GLOVE BIOGEL PI INDICATOR 8 (GLOVE) ×1
GOWN STRL REUS W/ TWL LRG LVL3 (GOWN DISPOSABLE) ×3 IMPLANT
GOWN STRL REUS W/ TWL XL LVL3 (GOWN DISPOSABLE) IMPLANT
GOWN STRL REUS W/TWL LRG LVL3 (GOWN DISPOSABLE) ×8
GOWN STRL REUS W/TWL XL LVL3 (GOWN DISPOSABLE)
K-WIRE ORTHOPEDIC 1.4X150L (WIRE) ×4
KWIRE ORTHOPEDIC 1.4X150L (WIRE) ×2 IMPLANT
NEEDLE HYPO 22GX1.5 SAFETY (NEEDLE) IMPLANT
NS IRRIG 1000ML POUR BTL (IV SOLUTION) ×2 IMPLANT
PACK BASIN DAY SURGERY FS (CUSTOM PROCEDURE TRAY) ×2 IMPLANT
PAD CAST 4YDX4 CTTN HI CHSV (CAST SUPPLIES) ×1 IMPLANT
PADDING CAST ABS 4INX4YD NS (CAST SUPPLIES) ×2
PADDING CAST ABS COTTON 4X4 ST (CAST SUPPLIES) ×2 IMPLANT
PADDING CAST COTTON 4X4 STRL (CAST SUPPLIES) ×2
PADDING CAST COTTON 6X4 STRL (CAST SUPPLIES) ×2 IMPLANT
PENCIL BUTTON HOLSTER BLD 10FT (ELECTRODE) ×2 IMPLANT
PLATE 1/3 TUBULAR 7H (Plate) ×2 IMPLANT
SCREW CANCELLOUS 4.0X14 (Screw) ×2 IMPLANT
SCREW CANCELLOUS FT 4.0X18MM (Screw) ×1 IMPLANT
SCREW CANN ASNIS III 4.0X40MM (Screw) ×2 IMPLANT
SCREW CONN 4.0X30MM (Screw) ×2 IMPLANT
SCREW CORT 10X3.5XST NS LF (Screw) IMPLANT
SCREW CORTEX ST MATTA 3.5X12MM (Screw) ×2 IMPLANT
SCREW CORTICAL 3.5 (Screw) ×4 IMPLANT
SLEEVE SCD COMPRESS KNEE MED (MISCELLANEOUS) IMPLANT
SPLINT FAST PLASTER 5X30 (CAST SUPPLIES) ×20
SPLINT PLASTER CAST FAST 5X30 (CAST SUPPLIES) ×20 IMPLANT
SPONGE LAP 4X18 X RAY DECT (DISPOSABLE) ×2 IMPLANT
SUCTION FRAZIER TIP 10 FR DISP (SUCTIONS) IMPLANT
SUT ETHILON 3 0 PS 1 (SUTURE) ×4 IMPLANT
SUT MNCRL AB 4-0 PS2 18 (SUTURE) ×2 IMPLANT
SUT MON AB 2-0 CT1 36 (SUTURE) ×2 IMPLANT
SUT VIC AB 0 SH 27 (SUTURE) ×2 IMPLANT
SUT VIC AB 2-0 SH 27 (SUTURE)
SUT VIC AB 2-0 SH 27XBRD (SUTURE) IMPLANT
SYR BULB 3OZ (MISCELLANEOUS) ×2 IMPLANT
SYR CONTROL 10ML LL (SYRINGE) IMPLANT
TOWEL OR 17X24 6PK STRL BLUE (TOWEL DISPOSABLE) ×4 IMPLANT
TOWEL OR NON WOVEN STRL DISP B (DISPOSABLE) ×2 IMPLANT
TUBE CONNECTING 20X1/4 (TUBING) ×2 IMPLANT
UNDERPAD 30X30 (UNDERPADS AND DIAPERS) ×1 IMPLANT

## 2015-01-31 NOTE — Interval H&P Note (Signed)
History and Physical Interval Note:  01/31/2015 7:24 AM  Mary Choi  has presented today for surgery, with the diagnosis of RIGHT ANKLE FRACTURE  The various methods of treatment have been discussed with the patient and family. After consideration of risks, benefits and other options for treatment, the patient has consented to  Procedure(s): OPEN REDUCTION INTERNAL FIXATION (ORIF) ANKLE FRACTURE (Right) as a surgical intervention .  The patient's history has been reviewed, patient examined, no change in status, stable for surgery.  I have reviewed the patient's chart and labs.  Questions were answered to the patient's satisfaction.     [name]

## 2015-01-31 NOTE — Transfer of Care (Signed)
Immediate Anesthesia Transfer of Care Note  Patient: Mary Choi  Procedure(s) Performed: Procedure(s): OPEN REDUCTION INTERNAL FIXATION (ORIF) ANKLE FRACTURE (Right)  Patient Location: PACU  Anesthesia Type:General  Level of Consciousness: awake, alert  and oriented  Airway & Oxygen Therapy: Patient Spontanous Breathing and Patient connected to face mask oxygen  Post-op Assessment: Report given to RN and Post -op Vital signs reviewed and stable  Post vital signs: Reviewed and stable  Last Vitals:  Filed Vitals:   01/31/15 1207  BP:   Pulse: 95  Temp:   Resp: 10    Complications: No apparent anesthesia complications

## 2015-01-31 NOTE — Anesthesia Procedure Notes (Addendum)
Anesthesia Regional Block:  Popliteal block  Pre-Anesthetic Checklist: ,, timeout performed, Correct Patient, Correct Site, Correct Laterality, Correct Procedure, Correct Position, site marked, Risks and benefits discussed,  Surgical consent,  Pre-op evaluation,  At surgeon's request and post-op pain management  Laterality: Right and Lower  Prep: chloraprep       Needles:   Needle Type: Echogenic Needle     Needle Length: 9cm 9 cm Needle Gauge: 21 and 21 G  Needle insertion depth: 5 cm   Additional Needles:  Procedures: ultrasound guided (picture in chart) and nerve stimulator Popliteal block Narrative:  Start time: 01/31/2015 9:35 AM End time: 01/31/2015 9:55 AM Injection made incrementally with aspirations every 5 mL.  Performed by: Personally  Anesthesiologist: MASSAGEE, TERRY   Anesthesia Regional Block:  Adductor canal block  Pre-Anesthetic Checklist: ,, timeout performed, Correct Patient, Correct Site, Correct Laterality, Correct Procedure, Correct Position, site marked, Risks and benefits discussed,  Surgical consent,  Pre-op evaluation,  At surgeon's request and post-op pain management  Laterality: Right and Lower  Prep: chloraprep       Needles:   Needle Type: Echogenic Needle     Needle Length: 9cm 9 cm Needle Gauge: 21 and 21 G  Needle insertion depth: 7 cm   Additional Needles:  Procedures: ultrasound guided (picture in chart) Adductor canal block Narrative:  Start time: 01/31/2015 9:35 AM End time: 01/31/2015 9:55 AM Injection made incrementally with aspirations every 5 mL.  Performed by: Personally  Anesthesiologist: MASSAGEE, TERRY   Procedure Name: LMA Insertion Date/Time: 01/31/2015 10:37 AM Performed by: Melynda Ripple D Pre-anesthesia Checklist: Patient identified, Emergency Drugs available, Suction available and Patient being monitored Patient Re-evaluated:Patient Re-evaluated prior to inductionOxygen Delivery Method: Circle System  Utilized Preoxygenation: Pre-oxygenation with 100% oxygen Intubation Type: IV induction Ventilation: Mask ventilation without difficulty LMA: LMA inserted LMA Size: 4.0 Number of attempts: 1 Airway Equipment and Method: Bite block Placement Confirmation: positive ETCO2 Tube secured with: Tape Dental Injury: Teeth and Oropharynx as per pre-operative assessment

## 2015-01-31 NOTE — Anesthesia Postprocedure Evaluation (Signed)
Anesthesia Post Note  Patient: Mary Choi  Procedure(s) Performed: Procedure(s) (LRB): OPEN REDUCTION INTERNAL FIXATION (ORIF) ANKLE FRACTURE (Right)  Anesthesia type: General  Patient location: PACU  Post pain: Pain level controlled and Adequate analgesia  Post assessment: Post-op Vital signs reviewed, Patient's Cardiovascular Status Stable, Respiratory Function Stable, Patent Airway and Pain level controlled  Last Vitals:  Filed Vitals:   01/31/15 1300  BP: 144/89  Pulse: 92  Temp: 36.4 C  Resp: 16    Post vital signs: Reviewed and stable  Level of consciousness: awake, alert  and oriented  Complications: No apparent anesthesia complications

## 2015-01-31 NOTE — Anesthesia Preprocedure Evaluation (Addendum)
Anesthesia Evaluation  Patient identified by MRN, date of birth, ID band Patient awake    Reviewed: Allergy & Precautions, NPO status   History of Anesthesia Complications Negative for: history of anesthetic complications  Airway Mallampati: II       Dental  (+) Teeth Intact   Pulmonary asthma ,  breath sounds clear to auscultation        Cardiovascular hypertension, Rhythm:Regular Rate:Normal     Neuro/Psych    GI/Hepatic   Endo/Other  diabetes  Renal/GU      Musculoskeletal   Abdominal   Peds  Hematology  (+) anemia ,   Anesthesia Other Findings   Reproductive/Obstetrics                            Anesthesia Physical Anesthesia Plan  ASA: III  Anesthesia Plan: General   Post-op Pain Management:    Induction: Intravenous  Airway Management Planned: LMA  Additional Equipment:   Intra-op Plan:   Post-operative Plan: Extubation in OR  Informed Consent: I have reviewed the patients History and Physical, chart, labs and discussed the procedure including the risks, benefits and alternatives for the proposed anesthesia with the patient or authorized representative who has indicated his/her understanding and acceptance.   Dental advisory given  Plan Discussed with: CRNA and Surgeon  Anesthesia Plan Comments:        Anesthesia Quick Evaluation

## 2015-01-31 NOTE — Discharge Instructions (Signed)
Keep splint clean and dry till follow up  Non-weight bearing in the R leg  Continue ASA 325mg  daily for DVT prophylaxis  Call your surgeon if you experience:   1.  Fever over 101.0. 2.  Inability to urinate. 3.  Nausea and/or vomiting. 4.  Extreme swelling or bruising at the surgical site. 5.  Continued bleeding from the incision. 6.  Increased pain, redness or drainage from the incision. 7.  Problems related to your pain medication. 8. Any change in color, movement and/or sensation 9. Any problems and/or concerns  Regional Anesthesia Blocks  1. Numbness or the inability to move the "blocked" extremity may last from 3-48 hours after placement. The length of time depends on the medication injected and your individual response to the medication. If the numbness is not going away after 48 hours, call your surgeon.  2. The extremity that is blocked will need to be protected until the numbness is gone and the  Strength has returned. Because you cannot feel it, you will need to take extra care to avoid injury. Because it may be weak, you may have difficulty moving it or using it. You may not know what position it is in without looking at it while the block is in effect.  3. For blocks in the legs and feet, returning to weight bearing and walking needs to be done carefully. You will need to wait until the numbness is entirely gone and the strength has returned. You should be able to move your leg and foot normally before you try and bear weight or walk. You will need someone to be with you when you first try to ensure you do not fall and possibly risk injury.  4. Bruising and tenderness at the needle site are common side effects and will resolve in a few days.  5. Persistent numbness or new problems with movement should be communicated to the surgeon or the [facility] [phone] [place] [phone]).

## 2015-01-31 NOTE — Progress Notes (Signed)
Assisted Dr. Orene Desanctis with right, ultrasound guided, popliteal/saphenous block. Side rails up, monitors on throughout procedure. See vital signs in flow sheet. Tolerated Procedure well.

## 2015-01-31 NOTE — Op Note (Signed)
01/31/2015  11:35 AM  PATIENT:  Mary Choi    PRE-OPERATIVE DIAGNOSIS:  RIGHT ANKLE FRACTURE  POST-OPERATIVE DIAGNOSIS:  Same  PROCEDURE:  OPEN REDUCTION INTERNAL FIXATION (ORIF) ANKLE FRACTURE  SURGEON:  Keyonte Cookston, Ernesta Amble, MD  ASSISTANT: Lovett Calender, PA-C, She was present and scrubbed throughout the case, critical for completion in a timely fashion, and for retraction, instrumentation, and closure.   ANESTHESIA:   gen  PREOPERATIVE INDICATIONS:  [name] is a  64 y.o. female with a diagnosis of RIGHT ANKLE FRACTURE who failed conservative measures and elected for surgical management.    The risks benefits and alternatives were discussed with the patient preoperatively including but not limited to the risks of infection, bleeding, nerve injury, cardiopulmonary complications, the need for revision surgery, among others, and the patient was willing to proceed.  OPERATIVE IMPLANTS: 1/3 tubular and canulated sscrews  OPERATIVE FINDINGS: Unstable ankle fracture. Stable syndesmosis post op  BLOOD LOSS: min  COMPLICATIONS: none  TOURNIQUET TIME: 45  OPERATIVE PROCEDURE:  Patient was identified in the preoperative holding area and site was marked by me He was transported to the operating theater and placed on the table in supine position taking care to pad all bony prominences. After a preincinduction time out anesthesia was induced. The right lower extremity was prepped and draped in normal sterile fashion and a pre-incision timeout was performed. Erik Obey Lotito received ancef for preoperative antibiotics.   I made a lateral incision of roughly 7 cm dissection was carried down sharply to the distal fibula and then spreading dissection was used proximally to protect the superficial peroneal nerve. I sharply incised the periosteum and took care to protect the peroneal tendons. I then debrided the fracture site and performed a reduction maneuver which was held in place with a  clamp.   The fracture was highly comminuted so I set the length radiographically  I then selected a 7-hole one third tubular plate and placed in a neutralization fashion care was taken distally so as not to penetrate the joint with the cancellus screws.  I then turned my attention medially where I created a 4 cm incision and dissected sharply down to the medial Mal fracture taking care to protect the saphenous vein. I debrided the fracture and reduced and held in place with a tenaculum. I then drilled and placed 2 partially threaded 45 mm cannulated screws one anterior and one posterior across the fracture.  I then stressed the syndesmosis and it was stable  I assesed the posterior mal piece and it was small enough to not require fixation as it involved less than 20% of the articular surface  The wound was then thoroughly irrigated and closed using a 0 Vicryl and absorbable Monocryl sutures. He was placed in a short leg splint.   POST OPERATIVE PLAN: Non-weightbearing. DVT prophylaxis will consist of mobility and ASA  This note was generated using a template and dragon dictation system. In light of that, I have reviewed the note and all aspects of it are applicable to this case. Any dictation errors are due to the computerized dictation system.

## 2015-02-01 DIAGNOSIS — S82841A Displaced bimalleolar fracture of right lower leg, initial encounter for closed fracture: Secondary | ICD-10-CM | POA: Diagnosis not present

## 2015-02-01 LAB — GLUCOSE, CAPILLARY
Glucose-Capillary: 284 mg/dL — ABNORMAL HIGH (ref 65–99)
Glucose-Capillary: 173 mg/dL — ABNORMAL HIGH (ref 65–99)

## 2015-02-02 ENCOUNTER — Encounter (HOSPITAL_BASED_OUTPATIENT_CLINIC_OR_DEPARTMENT_OTHER): Payer: Self-pay | Admitting: Orthopedic Surgery

## 2015-05-15 ENCOUNTER — Ambulatory Visit: Payer: Medicare Other | Admitting: Podiatry

## 2015-05-17 ENCOUNTER — Ambulatory Visit (INDEPENDENT_AMBULATORY_CARE_PROVIDER_SITE_OTHER): Payer: Medicare Other | Admitting: Podiatry

## 2015-05-17 VITALS — BP 103/66 | HR 102 | Resp 16 | Ht 63.5 in | Wt 167.0 lb

## 2015-05-17 DIAGNOSIS — Q828 Other specified congenital malformations of skin: Secondary | ICD-10-CM

## 2015-05-17 DIAGNOSIS — E1151 Type 2 diabetes mellitus with diabetic peripheral angiopathy without gangrene: Secondary | ICD-10-CM | POA: Diagnosis not present

## 2015-05-17 DIAGNOSIS — M79606 Pain in leg, unspecified: Secondary | ICD-10-CM

## 2015-05-17 DIAGNOSIS — B351 Tinea unguium: Secondary | ICD-10-CM | POA: Diagnosis not present

## 2015-05-17 NOTE — Progress Notes (Signed)
   Subjective:    Patient ID: Mary Choi, female    DOB: 02-24-1951, 64 y.o.   MRN: [id number]  HPI Patient presents with bilateral debridement.  Patient also presents with needing diabetic foot care. Pt diabetic; sugar=110 this am; A1C=6.0.  Review of Systems  Cardiovascular: Positive for leg swelling.  All other systems reviewed and are negative.      Objective:   Physical Exam        Assessment & Plan:

## 2015-05-22 NOTE — Progress Notes (Signed)
Subjective:     Patient ID: Mary Choi, female   DOB: Dec 06, 1950, 64 y.o.   MRN: [id number]  HPI patient presents stating she needs nail trimming and callus care secondary to long-term diabetes pain and inability to take care of herself   Review of Systems     Objective:   Physical Exam Neurovascular status was found to be intact with long-term diminishment of sharp Dole vibratory and nail disease 1-5 both feet with thick yellow brittle nails and keratotic lesion sub-5 of both feet that are painful with lucent-type core worse    Assessment:     At risk diabetic with mycotic nail infections and lesion formation    Plan:     Debride nailbeds 1-5 both feet and lesions with no iatrogenic bleeding noted

## 2016-03-10 ENCOUNTER — Ambulatory Visit (HOSPITAL_COMMUNITY)
Admission: EM | Admit: 2016-03-10 | Discharge: 2016-03-10 | Disposition: A | Discharge status: Home / Self Care | Payer: Medicare Other | Attending: Family Medicine | Admitting: Family Medicine

## 2016-03-10 ENCOUNTER — Encounter (HOSPITAL_COMMUNITY): Payer: Self-pay | Admitting: Emergency Medicine

## 2016-03-10 DIAGNOSIS — W57XXXA Bitten or stung by nonvenomous insect and other nonvenomous arthropods, initial encounter: Secondary | ICD-10-CM | POA: Diagnosis not present

## 2016-03-10 DIAGNOSIS — S40861A Insect bite (nonvenomous) of right upper arm, initial encounter: Secondary | ICD-10-CM

## 2016-03-10 DIAGNOSIS — L259 Unspecified contact dermatitis, unspecified cause: Secondary | ICD-10-CM

## 2016-03-10 MED ORDER — METHYLPREDNISOLONE ACETATE 80 MG/ML IJ SUSP
INTRAMUSCULAR | Status: AC
  Filled 2016-03-10: qty 1

## 2016-03-10 MED ORDER — METHYLPREDNISOLONE ACETATE 80 MG/ML IJ SUSP
80.0000 mg | Freq: Once | INTRAMUSCULAR | Status: AC
  Administered 2016-03-10: 80 mg via INTRAMUSCULAR

## 2016-03-10 MED ORDER — TRIAMCINOLONE ACETONIDE 0.1 % EX CREA: 1.0000 "application " | TOPICAL_CREAM | Freq: Two times a day (BID) | CUTANEOUS | 0 refills | Status: DC | PRN

## 2016-03-10 NOTE — ED Triage Notes (Signed)
The patient presented to the [facility] with a complaint of a rash on both hands and her left arm that she believed to be a reaction from bed bugs after staying in a hotel two days ago.

## 2016-03-10 NOTE — Discharge Instructions (Signed)
We gave you a steroid shot today to help with the swelling and decrease the spread/reaction from the bites. Recommend use Triamcinolone cream twice a day to area to help with itching. Follow-up with your primary care provider if not improving in 2 to 3 days.

## 2016-03-10 NOTE — ED Provider Notes (Signed)
CSN: [id number]     Arrival date & time 03/10/16  1204 History   First MD Initiated Contact with Patient 03/10/16 1235     Chief Complaint  Patient presents with  . Rash   (Consider location/radiation/quality/duration/timing/severity/associated sxs/prior Treatment) Patient presents with multiple bites on left elbow area and shoulder. Also has bites on both fingers near PJP joint. Noticed bites yesterday and has spread to shoulder and fingers today. Very itchy. No drainage. Has not applied or taken any oral medication for symptoms yet. Recently stayed in a hotel and has had similar rash/bites when dx with bed bugs in the past.     Rash  Associated symptoms: no fatigue, no fever and no headaches     Past Medical History:  Diagnosis Date  . Allergy   . Asthma   . Cancer ([place])   . Diabetes mellitus without complication ([place])   . Hypertension    Past Surgical History:  Procedure Laterality Date  . ABDOMINAL HYSTERECTOMY    . BREAST SURGERY    . CESAREAN SECTION    . ORIF ANKLE FRACTURE Right 01/31/2015   Procedure: OPEN REDUCTION INTERNAL FIXATION (ORIF) ANKLE FRACTURE;  Surgeon: Renette Butters, MD;  Location: [facility];  Service: Orthopedics;  Laterality: Right;   Family History  Problem Relation Age of Onset  . Diabetes Mother   . Hypertension Mother    Social History  Substance Use Topics  . Smoking status: Never Smoker  . Smokeless tobacco: Not on file  . Alcohol use Yes   OB History    No data available     Review of Systems  Constitutional: Negative for chills, fatigue and fever.  Skin: Positive for rash.  Neurological: Negative for headaches.    Allergies  Review of patient's allergies indicates no known allergies.  Home Medications   Prior to Admission medications   Medication Sig Start Date End Date Taking? Authorizing Provider  albuterol (PROVENTIL HFA;VENTOLIN HFA) 108 (90 BASE) MCG/ACT inhaler Inhale 2 puffs into the lungs every 6  (six) hours as needed for wheezing.   Yes Historical Provider, MD  amLODipine (NORVASC) 10 MG tablet Take 10 mg by mouth daily.   Yes Historical Provider, MD  Insulin Glargine (TOUJEO SOLOSTAR) 300 UNIT/ML SOPN Inject 23 Units into the skin at bedtime.   Yes Historical Provider, MD  losartan-hydrochlorothiazide (HYZAAR) 100-25 MG per tablet Take 1 tablet by mouth daily.   Yes Historical Provider, MD  metFORMIN (GLUCOPHAGE) 1000 MG tablet Take 1,000 mg by mouth at bedtime.    Yes Historical Provider, MD  pioglitazone (ACTOS) 15 MG tablet Take 15 mg by mouth at bedtime.   Yes Historical Provider, MD  rosuvastatin (CRESTOR) 10 MG tablet Take 10 mg by mouth daily.   Yes Historical Provider, MD  triamcinolone cream (KENALOG) 0.1 % Apply 1 application topically 2 (two) times daily as needed. 03/10/16   Katy Apo, NP   Meds Ordered and Administered this Visit   Medications  methylPREDNISolone acetate (DEPO-MEDROL) injection 80 mg (80 mg Intramuscular Given 03/10/16 1307)    BP (!) 157/105 (BP Location: Left Wrist) Comment: brachial not available  Pulse 91   Temp 98 F (36.7 C) (Oral)   Resp 18   SpO2 99%  No data found.   Physical Exam  Constitutional: She is oriented to person, place, and time. She appears well-developed and well-nourished. No distress.  Neck: Normal range of motion. Neck supple.  Cardiovascular: Normal rate, regular rhythm and  normal heart sounds.   Musculoskeletal: Normal range of motion.  Lymphadenopathy:    She has no cervical adenopathy.  Neurological: She is alert and oriented to person, place, and time.  Skin: Skin is warm, dry and intact. Capillary refill takes less than 2 seconds. Rash noted. Rash is papular and urticarial.     Multiple bites areas clustered around her left elbow, left shoulder, wrist and bilaterally on the proximal area of her phalanges and around her ankles. The bites around her elbow and shoulder are urticarial as well with local  reaction. Slight swelling and redness present. No discharge. Non-tender. No lesions on chest, back, legs or face.   Psychiatric: She has a normal mood and affect. Her behavior is normal. Judgment and thought content normal.    Urgent Care Course   Clinical Course    Procedures (including critical care time)  Labs Review Labs Reviewed - No data to display  Imaging Review No results found.   Visual Acuity Review  Right Eye Distance:   Left Eye Distance:   Bilateral Distance:    Right Eye Near:   Left Eye Near:    Bilateral Near:         MDM   1. Insect bite of multiple sites of right upper arm with local reaction, initial encounter   2. Contact dermatitis    Discussed that can not confirm bed bug bites but definitely some type of insect bite with local reaction. A few areas around her ankles and fingers appear more as a contact dermatitis. Discussed various treatment options. Gave DepoMedrol 80mg  IM today. Reviewed that it may increase her blood pressure and cause an increase in her glucose levels. We did not recheck her BP level today. Recommend monitor BP and may need to follow-up with her primary care provider if remains elevated. May use Triamcinolone cream twice a day to affected areas. Recommend cool compresses to area for comfort and to decrease itching. Follow-up with her primary care provider in 3 to 5 days if not improving.     Katy Apo, NP 03/11/16 1037

## 2016-09-18 ENCOUNTER — Other Ambulatory Visit: Payer: Self-pay | Admitting: Family Medicine

## 2016-09-18 ENCOUNTER — Ambulatory Visit
Admission: RE | Admit: 2016-09-18 | Discharge: 2016-09-18 | Disposition: A | Discharge status: Home / Self Care | Payer: Medicare Other | Source: Ambulatory Visit | Attending: Family Medicine | Admitting: Family Medicine

## 2016-09-18 DIAGNOSIS — M4000 Postural kyphosis, site unspecified: Secondary | ICD-10-CM

## 2017-08-08 ENCOUNTER — Ambulatory Visit: Payer: Medicare Other | Admitting: Podiatry

## 2017-08-08 ENCOUNTER — Encounter: Payer: Self-pay | Admitting: Podiatry

## 2017-08-08 DIAGNOSIS — M79674 Pain in right toe(s): Secondary | ICD-10-CM | POA: Diagnosis not present

## 2017-08-08 DIAGNOSIS — B351 Tinea unguium: Secondary | ICD-10-CM

## 2017-08-08 DIAGNOSIS — M79675 Pain in left toe(s): Secondary | ICD-10-CM | POA: Diagnosis not present

## 2017-08-13 NOTE — Progress Notes (Signed)
Subjective:   Patient ID: Mary Choi, female   DOB: 67 y.o.   MRN: [id number]   HPI Patient presents with nail disease 1-5 both feet that are thick yellow moderately brittle and painful   ROS      Objective:  Physical Exam  Neurovascular status unchanged with thick yellow brittle nailbeds 1-5 both feet that are painful with palpation     Assessment:  Mycotic nail infection with pain 1-5 both feet     Plan:  H&P condition reviewed and debridement of nails accomplished 1-5 both feet with no iatrogenic bleeding noted.  Reappoint for routine care

## 2018-02-11 ENCOUNTER — Other Ambulatory Visit: Payer: Self-pay | Admitting: Family Medicine

## 2018-02-11 DIAGNOSIS — M545 Low back pain: Secondary | ICD-10-CM

## 2018-02-12 ENCOUNTER — Ambulatory Visit
Admission: RE | Admit: 2018-02-12 | Discharge: 2018-02-12 | Disposition: A | Discharge status: Home / Self Care | Payer: Medicare Other | Source: Ambulatory Visit | Attending: Family Medicine | Admitting: Family Medicine

## 2018-02-12 DIAGNOSIS — M545 Low back pain: Secondary | ICD-10-CM

## 2018-02-12 MED ORDER — GADOBENATE DIMEGLUMINE 529 MG/ML IV SOLN
15.0000 mL | Freq: Once | INTRAVENOUS | Status: AC | PRN
  Administered 2018-02-12: 15 mL via INTRAVENOUS

## 2018-04-24 ENCOUNTER — Other Ambulatory Visit: Payer: Self-pay | Admitting: Obstetrics & Gynecology

## 2018-04-24 DIAGNOSIS — N63 Unspecified lump in unspecified breast: Secondary | ICD-10-CM

## 2018-04-29 ENCOUNTER — Ambulatory Visit
Admission: RE | Admit: 2018-04-29 | Discharge: 2018-04-29 | Disposition: A | Discharge status: Home / Self Care | Payer: Medicare Other | Source: Ambulatory Visit | Attending: Obstetrics & Gynecology | Admitting: Obstetrics & Gynecology

## 2018-04-29 DIAGNOSIS — N63 Unspecified lump in unspecified breast: Secondary | ICD-10-CM

## 2018-04-29 HISTORY — DX: Malignant neoplasm of unspecified site of unspecified female breast: C50.919

## 2018-10-14 ENCOUNTER — Ambulatory Visit: Payer: Medicare Other | Admitting: Podiatry

## 2018-10-14 ENCOUNTER — Encounter: Payer: Self-pay | Admitting: Podiatry

## 2018-10-14 ENCOUNTER — Other Ambulatory Visit: Payer: Self-pay

## 2018-10-14 DIAGNOSIS — M79674 Pain in right toe(s): Secondary | ICD-10-CM | POA: Diagnosis not present

## 2018-10-14 DIAGNOSIS — B351 Tinea unguium: Secondary | ICD-10-CM

## 2018-10-14 DIAGNOSIS — M79675 Pain in left toe(s): Secondary | ICD-10-CM

## 2018-10-18 NOTE — Progress Notes (Signed)
Subjective:   Patient ID: Mary Choi, female   DOB: 68 y.o.   MRN: [id number]   HPI Patient presents with inserts and was concerned about whether or not she might need new inserts and has thick yellow brittle nailbeds that she is unable to take care of herself   ROS      Objective:  Physical Exam  Neurovascular status unchanged with thick yellow brittle nailbeds 1-5 both feet that are painful and make shoe gear difficult     Assessment:  Mycotic nail infection with pain 1-5 both feet     Plan:  Debride painful nailbeds 1-5 both feet with no iatrogenic bleeding noted

## 2018-12-17 ENCOUNTER — Other Ambulatory Visit: Payer: Self-pay | Admitting: Internal Medicine

## 2018-12-28 ENCOUNTER — Other Ambulatory Visit (HOSPITAL_COMMUNITY)
Admission: RE | Admit: 2018-12-28 | Discharge: 2018-12-28 | Disposition: A | Discharge status: Home / Self Care | Payer: Medicare Other | Source: Ambulatory Visit | Attending: Internal Medicine | Admitting: Internal Medicine

## 2018-12-28 ENCOUNTER — Other Ambulatory Visit: Payer: Self-pay

## 2018-12-28 DIAGNOSIS — Z1159 Encounter for screening for other viral diseases: Secondary | ICD-10-CM | POA: Diagnosis present

## 2018-12-30 ENCOUNTER — Other Ambulatory Visit: Payer: Self-pay | Admitting: Family Medicine

## 2018-12-30 DIAGNOSIS — R0600 Dyspnea, unspecified: Secondary | ICD-10-CM

## 2018-12-30 LAB — NOVEL CORONAVIRUS, NAA (HOSP ORDER, SEND-OUT TO REF LAB; TAT 18-24 HRS): SARS-CoV-2, NAA: NOT DETECTED

## 2018-12-31 ENCOUNTER — Ambulatory Visit (INDEPENDENT_AMBULATORY_CARE_PROVIDER_SITE_OTHER): Payer: Medicare Other | Admitting: Internal Medicine

## 2018-12-31 ENCOUNTER — Other Ambulatory Visit: Payer: Self-pay

## 2018-12-31 DIAGNOSIS — R0600 Dyspnea, unspecified: Secondary | ICD-10-CM

## 2018-12-31 LAB — PULMONARY FUNCTION TEST
DL/VA % pred: 106 %
DL/VA: 4.42 ml/min/mmHg/L
DLCO unc % pred: 85 %
DLCO unc: 16.14 ml/min/mmHg
FEF 25-75 Post: 1.66 L/sec
FEF 25-75 Pre: 1.34 L/sec
FEF2575-%Change-Post: 23 %
FEF2575-%Pred-Post: 102 %
FEF2575-%Pred-Pre: 82 %
FEV1-%Change-Post: 4 %
FEV1-%Pred-Post: 99 %
FEV1-%Pred-Pre: 95 %
FEV1-Post: 1.74 L
FEV1-Pre: 1.66 L
FEV1FVC-%Change-Post: 3 %
FEV1FVC-%Pred-Pre: 99 %
FEV6-%Change-Post: 0 %
FEV6-%Pred-Post: 100 %
FEV6-%Pred-Pre: 99 %
FEV6-Post: 2.16 L
FEV6-Pre: 2.15 L
FEV6FVC-%Change-Post: 0 %
FEV6FVC-%Pred-Post: 104 %
FEV6FVC-%Pred-Pre: 104 %
FVC-%Change-Post: 0 %
FVC-%Pred-Post: 95 %
FVC-%Pred-Pre: 95 %
FVC-Post: 2.17 L
FVC-Pre: 2.15 L
Post FEV1/FVC ratio: 80 %
Post FEV6/FVC ratio: 100 %
Pre FEV1/FVC ratio: 77 %
Pre FEV6/FVC Ratio: 100 %
RV % pred: 90 %
RV: 1.94 L
TLC % pred: 82 %
TLC: 4.04 L

## 2018-12-31 NOTE — Progress Notes (Signed)
PFT done today. 

## 2019-01-12 ENCOUNTER — Telehealth: Payer: Self-pay | Admitting: Internal Medicine

## 2019-01-12 NOTE — Telephone Encounter (Signed)
Returned call to patient and advised she was just referred here for PFT only. Result will be faxed back to referring provider in which she should follow up with for results. She states she had a message re: mychart and a link?.  Made patient aware she did not have any pending appt here and it may have been a message that went out to all patient re: mychart or an error. Pt voices understanding and will f/u with referring provider for results. Nothing further needed.

## 2019-02-25 ENCOUNTER — Ambulatory Visit (INDEPENDENT_AMBULATORY_CARE_PROVIDER_SITE_OTHER): Payer: Medicare Other | Admitting: Pulmonary Disease

## 2019-02-25 ENCOUNTER — Ambulatory Visit (INDEPENDENT_AMBULATORY_CARE_PROVIDER_SITE_OTHER): Payer: Medicare Other

## 2019-02-25 ENCOUNTER — Encounter: Payer: Self-pay | Admitting: Pulmonary Disease

## 2019-02-25 ENCOUNTER — Other Ambulatory Visit: Payer: Self-pay

## 2019-02-25 VITALS — BP 114/62 | HR 108 | Temp 98.1°F | Ht 63.0 in | Wt 153.6 lb

## 2019-02-25 DIAGNOSIS — R0600 Dyspnea, unspecified: Secondary | ICD-10-CM

## 2019-02-25 DIAGNOSIS — R0609 Other forms of dyspnea: Secondary | ICD-10-CM

## 2019-02-25 NOTE — Patient Instructions (Addendum)
Shortness of breath with exertion  We will obtain an echocardiogram Obtain a chest x-ray  I will see you back in about 4 weeks Call with any significant concern

## 2019-02-25 NOTE — Progress Notes (Signed)
Subjective:    Patient ID: Mary Choi, female    DOB: 1950/11/18, 68 y.o.   MRN: [id number]  Patient being seen for shortness of breath  History of asthma She was recently on anoro which he used for about 2 months-did not really help symptoms Recently started on Trelegy-has been using it for about a week and has not noticed any significant improvement in symptoms as well  She is short of breath with mild to moderate exertion  Symptoms have been ongoing for about a year  Relates symptom onset to when some work was done on a home on further reflection There was a lot of painting and she was in the home when the construction work was being done  She is a non-smoker No pets No significant other changes in her home environment Scientist, research (physical sciences), worked in a bookstore  History of hypertension  She has been getting more anxious about ongoing events  Recently noted to be tachycardic during the visit and cardiology evaluation was scheduled     Past Medical History:  Diagnosis Date  . Allergy   . Asthma   . Breast cancer ([place]) 1996  . Cancer ([place])   . Diabetes mellitus without complication ([place])   . Hypertension    Family History  Problem Relation Age of Onset  . Diabetes Mother   . Hypertension Mother    Non-smoker No pertinent occupational history No pets No recent significant travel   Review of Systems  Constitutional: Negative for fever and unexpected weight change.  HENT: Negative for congestion, dental problem, ear pain, nosebleeds, postnasal drip, rhinorrhea, sinus pressure, sneezing, sore throat and trouble swallowing.   Eyes: Negative for redness and itching.  Respiratory: Positive for shortness of breath. Negative for cough, chest tightness and wheezing.   Cardiovascular: Positive for palpitations. Negative for leg swelling.  Gastrointestinal: Negative for nausea and vomiting.  Genitourinary: Negative for dysuria.   Musculoskeletal: Negative for joint swelling.  Skin: Negative for rash.  Allergic/Immunologic: Positive for environmental allergies. Negative for food allergies and immunocompromised state.  Neurological: Negative for headaches.  Hematological: Does not bruise/bleed easily.  Psychiatric/Behavioral: Negative for dysphoric mood. The patient is not nervous/anxious.        Objective:   Physical Exam Constitutional:      Appearance: Normal appearance.  HENT:     Head: Normocephalic and atraumatic.  Eyes:     Extraocular Movements: Extraocular movements intact.     Pupils: Pupils are equal, round, and reactive to light.  Neck:     Musculoskeletal: Normal range of motion and neck supple. No neck rigidity or muscular tenderness.  Cardiovascular:     Rate and Rhythm: Regular rhythm. Tachycardia present.     Pulses: Normal pulses.     Heart sounds: Normal heart sounds. No murmur.  Pulmonary:     Effort: Pulmonary effort is normal. No respiratory distress.     Breath sounds: Normal breath sounds. No stridor. No wheezing or rhonchi.  Abdominal:     General: There is no distension.     Tenderness: There is no abdominal tenderness.  Musculoskeletal: Normal range of motion.        General: No swelling or tenderness.  Skin:    General: Skin is warm and dry.  Neurological:     General: No focal deficit present.     Mental Status: She is alert and oriented to person, place, and time.     Cranial Nerves: No cranial nerve deficit.  Psychiatric:        Mood and Affect: Mood normal.        Behavior: Behavior normal.    BP 114/62 (BP Location: Left Arm, Cuff Size: Normal)   Pulse (!) 108   Temp 98.1 F (36.7 C) (Oral)   Ht 5\' 3"  (1.6 m)   Wt 153 lb 9.6 oz (69.7 kg)   SpO2 96%   BMI 27.21 kg/m   PFT shows bronchodilator response in the small airways Did not reveal significant obstruction, no restriction     Assessment & Plan:  .  Shortness of breath with exertion  .  No shortness  of breath at rest  .  Poor inhaler technique-this was reviewed during the visit .  She may be developing some anxiety which is further complicating her symptoms  .  Findings on the PFT does not appear significant enough to require Trelegy -We did discuss holding Trelegy for a few days -Ensuring that she is using proper technique with a rescue inhaler -If within a few days she feels that she needs a rescue inhaler on a daily basis then she should go back to using Trelegy Another option will be to have her on an Advair or Symbicort-combination steroid and long-acting bronchodilator-not sure anticholinergics play a role at present  Plan: .  We will schedule her for an echocardiogram to assess cardiac function .  Obtain a chest x-ray  .  Encouraged to call with significant concerns .  I will see her back in the office in about 4 weeks  .  A lot of gain is expected with proper inhaler technique .  I also encouraged to look for more information about inhaler technique   .  Treatment for anxiety may be considered if all testing is checking out within normal limits

## 2019-03-02 ENCOUNTER — Other Ambulatory Visit: Payer: Self-pay

## 2019-03-02 ENCOUNTER — Ambulatory Visit (HOSPITAL_COMMUNITY): Payer: Medicare Other | Attending: Cardiovascular Disease

## 2019-03-02 DIAGNOSIS — R0609 Other forms of dyspnea: Secondary | ICD-10-CM | POA: Diagnosis not present

## 2019-03-02 DIAGNOSIS — R0600 Dyspnea, unspecified: Secondary | ICD-10-CM

## 2019-03-02 MED ORDER — PERFLUTREN LIPID MICROSPHERE
1.0000 mL | INTRAVENOUS | Status: AC | PRN
  Administered 2019-03-02: 1 mL via INTRAVENOUS

## 2019-03-04 ENCOUNTER — Other Ambulatory Visit (HOSPITAL_COMMUNITY): Payer: Medicare Other

## 2019-03-05 ENCOUNTER — Ambulatory Visit: Payer: Self-pay | Admitting: Cardiology

## 2019-03-29 ENCOUNTER — Ambulatory Visit (INDEPENDENT_AMBULATORY_CARE_PROVIDER_SITE_OTHER): Payer: Medicare Other | Admitting: Pulmonary Disease

## 2019-03-29 ENCOUNTER — Other Ambulatory Visit: Payer: Self-pay

## 2019-03-29 ENCOUNTER — Encounter: Payer: Self-pay | Admitting: Pulmonary Disease

## 2019-03-29 VITALS — BP 118/64 | HR 100 | Temp 97.1°F | Ht 63.0 in | Wt 155.2 lb

## 2019-03-29 DIAGNOSIS — J4520 Mild intermittent asthma, uncomplicated: Secondary | ICD-10-CM

## 2019-03-29 NOTE — Progress Notes (Signed)
Subjective:    Patient ID: Mary Choi, female    DOB: March 27, 1951, 68 y.o.   MRN: [id number]  Patient being seen for shortness of breath  Symptoms are much better at present Only using rescue inhaler about once a week or less She attributes the previous increased use with working in [employer]  Not short of breath with mild to moderate exertion Symptoms are definitely improved-has been using Trelegy  History of asthma She was recently on anoro which he used for about 2 months-did not really help symptoms Recently started on Trelegy-has been using it for about a week and has not noticed any significant improvement in symptoms as well   Relates symptom onset to when some work was done on a home on further reflection There was a lot of painting and she was in the home when the construction work was being done  She is a non-smoker No pets No significant other changes in her home environment Scientist, research (physical sciences), worked in a bookstore  History of hypertension      Past Medical History:  Diagnosis Date  . Allergy   . Asthma   . Breast cancer ([place]) 1996  . Cancer ([place])   . Diabetes mellitus without complication ([place])   . Hypertension    Family History  Problem Relation Age of Onset  . Diabetes Mother   . Hypertension Mother    Non-smoker No pertinent occupational history No pets No recent significant travel   Review of Systems  Constitutional: Negative for fever and unexpected weight change.  HENT: Negative for congestion, dental problem, ear pain, nosebleeds, postnasal drip, rhinorrhea, sinus pressure, sneezing, sore throat and trouble swallowing.   Eyes: Negative for redness and itching.  Respiratory: Negative for cough, chest tightness, shortness of breath and wheezing.   Cardiovascular: Negative for palpitations and leg swelling.  Gastrointestinal: Negative.  Negative for nausea and vomiting.  Genitourinary: Negative for dysuria.   Allergic/Immunologic: Positive for environmental allergies. Negative for food allergies and immunocompromised state.  Neurological: Negative for headaches.  Psychiatric/Behavioral: Negative for dysphoric mood. The patient is not nervous/anxious.        Objective:   Physical Exam Constitutional:      Appearance: Normal appearance.  HENT:     Head: Normocephalic and atraumatic.  Eyes:     Extraocular Movements: Extraocular movements intact.     Pupils: Pupils are equal, round, and reactive to light.  Neck:     Musculoskeletal: Normal range of motion and neck supple. No neck rigidity or muscular tenderness.  Cardiovascular:     Rate and Rhythm: Regular rhythm. Tachycardia present.     Pulses: Normal pulses.     Heart sounds: Normal heart sounds. No murmur.  Pulmonary:     Effort: Pulmonary effort is normal. No respiratory distress.     Breath sounds: Normal breath sounds. No stridor. No wheezing or rhonchi.  Abdominal:     General: There is no distension.     Tenderness: There is no abdominal tenderness.  Musculoskeletal: Normal range of motion.        General: No swelling or tenderness.  Skin:    General: Skin is warm and dry.  Neurological:     General: No focal deficit present.     Mental Status: She is alert and oriented to person, place, and time.     Cranial Nerves: No cranial nerve deficit.  Psychiatric:        Mood and Affect: Mood normal.  Behavior: Behavior normal.    BP 118/64 (BP Location: Left Arm, Cuff Size: Normal)   Pulse 100   Temp (!) 97.1 F (36.2 C) (Temporal)   Ht 5\' 3"  (1.6 m)   Wt 155 lb 3.2 oz (70.4 kg)   SpO2 99%   BMI 27.49 kg/m   PFT shows bronchodilator response in the small airways Did not reveal significant obstruction, no restriction     Assessment & Plan:  .  Shortness of breath with exertion -This is better compared with recent visit  .  No shortness of breath at rest  .  Poor inhaler technique-this was reviewed during  the visit   She has held Trelegy and is doing well  .  Encouraged to use albuterol as needed  .  Physical activity as tolerated  .  Echo cardiogram findings discussed-diastolic dysfunction however no evidence of heart failure .  Need to exercise regularly, control diabetes and blood pressure  .  No concerning findings on chest x-ray  Encouraged to call with significant concerns  I will see her back in the office in about 6 months

## 2019-03-29 NOTE — Patient Instructions (Signed)
No changes to be made today  I will see you back in the office in about 6 months  Call with any significant concerns  If you are having to use your rescue inhaler more than once to twice a week, this may mean that we need to put you on a maintenance inhaler

## 2019-05-28 ENCOUNTER — Other Ambulatory Visit: Payer: Self-pay | Admitting: Obstetrics & Gynecology

## 2019-05-28 DIAGNOSIS — Z1231 Encounter for screening mammogram for malignant neoplasm of breast: Secondary | ICD-10-CM

## 2019-06-02 ENCOUNTER — Other Ambulatory Visit: Payer: Self-pay

## 2019-06-02 ENCOUNTER — Ambulatory Visit
Admission: RE | Admit: 2019-06-02 | Discharge: 2019-06-02 | Disposition: A | Discharge status: Home / Self Care | Payer: Medicare Other | Source: Ambulatory Visit | Attending: Obstetrics & Gynecology | Admitting: Obstetrics & Gynecology

## 2019-06-02 DIAGNOSIS — Z1231 Encounter for screening mammogram for malignant neoplasm of breast: Secondary | ICD-10-CM

## 2019-06-14 ENCOUNTER — Other Ambulatory Visit: Payer: Self-pay | Admitting: *Deleted

## 2019-06-14 DIAGNOSIS — Z20822 Contact with and (suspected) exposure to covid-19: Secondary | ICD-10-CM

## 2019-06-17 LAB — NOVEL CORONAVIRUS, NAA: SARS-CoV-2, NAA: NOT DETECTED

## 2019-08-05 DIAGNOSIS — M7918 Myalgia, other site: Secondary | ICD-10-CM | POA: Diagnosis not present

## 2019-08-05 DIAGNOSIS — M546 Pain in thoracic spine: Secondary | ICD-10-CM | POA: Diagnosis not present

## 2019-08-05 DIAGNOSIS — M40294 Other kyphosis, thoracic region: Secondary | ICD-10-CM | POA: Diagnosis not present

## 2019-08-09 ENCOUNTER — Other Ambulatory Visit: Payer: Self-pay | Admitting: Orthopaedic Surgery

## 2019-08-09 DIAGNOSIS — M546 Pain in thoracic spine: Secondary | ICD-10-CM

## 2019-08-13 DIAGNOSIS — F064 Anxiety disorder due to known physiological condition: Secondary | ICD-10-CM | POA: Diagnosis not present

## 2019-08-13 DIAGNOSIS — E1169 Type 2 diabetes mellitus with other specified complication: Secondary | ICD-10-CM | POA: Diagnosis not present

## 2019-08-13 DIAGNOSIS — G4730 Sleep apnea, unspecified: Secondary | ICD-10-CM | POA: Diagnosis not present

## 2019-08-13 DIAGNOSIS — E782 Mixed hyperlipidemia: Secondary | ICD-10-CM | POA: Diagnosis not present

## 2019-08-13 DIAGNOSIS — I10 Essential (primary) hypertension: Secondary | ICD-10-CM | POA: Diagnosis not present

## 2019-08-18 DIAGNOSIS — I10 Essential (primary) hypertension: Secondary | ICD-10-CM | POA: Diagnosis not present

## 2019-08-18 DIAGNOSIS — E1169 Type 2 diabetes mellitus with other specified complication: Secondary | ICD-10-CM | POA: Diagnosis not present

## 2019-08-23 ENCOUNTER — Other Ambulatory Visit: Payer: Medicare Other

## 2019-08-27 ENCOUNTER — Other Ambulatory Visit: Payer: Self-pay

## 2019-08-27 ENCOUNTER — Ambulatory Visit
Admission: RE | Admit: 2019-08-27 | Discharge: 2019-08-27 | Disposition: A | Discharge status: Home / Self Care | Payer: Medicare PPO | Source: Ambulatory Visit | Attending: Orthopaedic Surgery | Admitting: Orthopaedic Surgery

## 2019-08-27 DIAGNOSIS — M546 Pain in thoracic spine: Secondary | ICD-10-CM | POA: Diagnosis not present

## 2019-09-02 DIAGNOSIS — M546 Pain in thoracic spine: Secondary | ICD-10-CM | POA: Diagnosis not present

## 2019-09-02 DIAGNOSIS — M40294 Other kyphosis, thoracic region: Secondary | ICD-10-CM | POA: Diagnosis not present

## 2019-09-02 DIAGNOSIS — M7918 Myalgia, other site: Secondary | ICD-10-CM | POA: Diagnosis not present

## 2019-11-01 DIAGNOSIS — M6281 Muscle weakness (generalized): Secondary | ICD-10-CM | POA: Diagnosis not present

## 2019-11-01 DIAGNOSIS — M546 Pain in thoracic spine: Secondary | ICD-10-CM | POA: Diagnosis not present

## 2019-11-01 DIAGNOSIS — G894 Chronic pain syndrome: Secondary | ICD-10-CM | POA: Diagnosis not present

## 2019-11-08 DIAGNOSIS — G894 Chronic pain syndrome: Secondary | ICD-10-CM | POA: Diagnosis not present

## 2019-11-08 DIAGNOSIS — M546 Pain in thoracic spine: Secondary | ICD-10-CM | POA: Diagnosis not present

## 2019-11-08 DIAGNOSIS — M6281 Muscle weakness (generalized): Secondary | ICD-10-CM | POA: Diagnosis not present

## 2019-11-17 DIAGNOSIS — D2271 Melanocytic nevi of right lower limb, including hip: Secondary | ICD-10-CM | POA: Diagnosis not present

## 2019-11-17 DIAGNOSIS — L918 Other hypertrophic disorders of the skin: Secondary | ICD-10-CM | POA: Diagnosis not present

## 2019-11-17 DIAGNOSIS — L821 Other seborrheic keratosis: Secondary | ICD-10-CM | POA: Diagnosis not present

## 2019-11-17 DIAGNOSIS — D2371 Other benign neoplasm of skin of right lower limb, including hip: Secondary | ICD-10-CM | POA: Diagnosis not present

## 2019-11-17 DIAGNOSIS — D2261 Melanocytic nevi of right upper limb, including shoulder: Secondary | ICD-10-CM | POA: Diagnosis not present

## 2019-11-17 DIAGNOSIS — D2362 Other benign neoplasm of skin of left upper limb, including shoulder: Secondary | ICD-10-CM | POA: Diagnosis not present

## 2019-11-17 DIAGNOSIS — L720 Epidermal cyst: Secondary | ICD-10-CM | POA: Diagnosis not present

## 2019-11-17 DIAGNOSIS — D225 Melanocytic nevi of trunk: Secondary | ICD-10-CM | POA: Diagnosis not present

## 2019-11-17 DIAGNOSIS — D2272 Melanocytic nevi of left lower limb, including hip: Secondary | ICD-10-CM | POA: Diagnosis not present

## 2019-11-22 DIAGNOSIS — M6281 Muscle weakness (generalized): Secondary | ICD-10-CM | POA: Diagnosis not present

## 2019-11-22 DIAGNOSIS — G894 Chronic pain syndrome: Secondary | ICD-10-CM | POA: Diagnosis not present

## 2019-11-22 DIAGNOSIS — M546 Pain in thoracic spine: Secondary | ICD-10-CM | POA: Diagnosis not present

## 2019-11-29 ENCOUNTER — Other Ambulatory Visit: Payer: Self-pay

## 2019-11-29 ENCOUNTER — Ambulatory Visit: Payer: Medicare PPO | Admitting: Pulmonary Disease

## 2019-11-29 ENCOUNTER — Encounter: Payer: Self-pay | Admitting: Pulmonary Disease

## 2019-11-29 VITALS — BP 118/68 | HR 105 | Temp 97.1°F | Ht 67.0 in | Wt 161.6 lb

## 2019-11-29 DIAGNOSIS — R0602 Shortness of breath: Secondary | ICD-10-CM | POA: Diagnosis not present

## 2019-11-29 NOTE — Progress Notes (Signed)
Subjective:    Patient ID: Mary Choi, female    DOB: September 13, 1950, 69 y.o.   MRN: [id number]  Patient being seen for shortness of breath  She states she feels short of breath sometimes just at rest Has not been using albuterol as much  Not short of breath with moderate exertion Denies any chest pains or chest discomfort  She did have a history of asthma  Non-smoker No pets  She was a Optometrist, worked in a bookstore  History of hypertension  We did talk about a previous PFT and echocardiogram which were reviewed together  She does do Silver sneakers for exercise  Past Medical History:  Diagnosis Date  . Allergy   . Asthma   . Breast cancer ([place]) 1996  . Cancer ([place])   . Diabetes mellitus without complication ([place])   . Hypertension    Family History  Problem Relation Age of Onset  . Diabetes Mother   . Hypertension Mother    Non-smoker No pertinent occupational history No pets No recent significant travel   Review of Systems  Constitutional: Negative for fever and unexpected weight change.  HENT: Negative.  Negative for congestion, dental problem, ear pain, nosebleeds, postnasal drip, rhinorrhea, sinus pressure, sneezing, sore throat and trouble swallowing.   Eyes: Negative for redness and itching.  Respiratory: Negative for cough, chest tightness, shortness of breath and wheezing.   Cardiovascular: Negative for palpitations and leg swelling.  Gastrointestinal: Negative.   Genitourinary: Negative for dysuria.  Musculoskeletal: Negative.   Allergic/Immunologic: Positive for environmental allergies. Negative for food allergies and immunocompromised state.  Neurological: Negative for headaches.       Objective:   Physical Exam Constitutional:      Appearance: Normal appearance.  HENT:     Head: Normocephalic and atraumatic.  Eyes:     Extraocular Movements: Extraocular movements intact.     Pupils: Pupils are equal, round, and reactive to  light.  Cardiovascular:     Rate and Rhythm: Regular rhythm.     Pulses: Normal pulses.     Heart sounds: Normal heart sounds. No murmur.  Pulmonary:     Effort: Pulmonary effort is normal. No respiratory distress.     Breath sounds: Normal breath sounds. No stridor. No wheezing or rhonchi.  Musculoskeletal:     Cervical back: Normal range of motion and neck supple. No tenderness. No muscular tenderness.  Neurological:     Mental Status: She is alert.    BP 118/68 (BP Location: Left Arm, Cuff Size: Normal)   Pulse (!) 105   Temp (!) 97.1 F (36.2 C) (Temporal)   Ht 5\' 7"  (1.702 m)   Wt 161 lb 9.6 oz (73.3 kg)   SpO2 100% Comment: RA  BMI 25.31 kg/m   PFT-reviewed with the patient today showing significant bronchodilator response in the small airways Did not reveal significant obstruction, no restriction  Echocardiogram shows some diastolic dysfunction, normal ejection fraction    Assessment & Plan:  .  Shortness of breath with exertion -Occurs more when she is at rest -I do believe anxiety may be playing a role -She is on antianxiety medications  .  Encouraged to use albuterol as needed  .  Physical activity as tolerated-importance of graded exercises was discussed with patient  .  No concerning findings on chest x-ray  Encouraged to call with significant concerns  I will see her back in the office in about 6 months  We can repeat her echocardiogram  and PFT if there is persistence of symptoms despite good attempts at graded exercises

## 2019-11-29 NOTE — Patient Instructions (Signed)
Shortness of breath with activity  Graded activities/exercises as tolerated  I do not believe we need to repeat your PFT or echocardiogram at the present time  Use albuterol as needed  I will see you in about 6 months  Call with significant concerns

## 2019-12-06 DIAGNOSIS — G894 Chronic pain syndrome: Secondary | ICD-10-CM | POA: Diagnosis not present

## 2019-12-06 DIAGNOSIS — M546 Pain in thoracic spine: Secondary | ICD-10-CM | POA: Diagnosis not present

## 2019-12-06 DIAGNOSIS — M6281 Muscle weakness (generalized): Secondary | ICD-10-CM | POA: Diagnosis not present

## 2019-12-13 ENCOUNTER — Ambulatory Visit: Payer: Medicare Other | Admitting: Podiatry

## 2019-12-13 ENCOUNTER — Encounter: Payer: Self-pay | Admitting: Podiatry

## 2019-12-13 ENCOUNTER — Other Ambulatory Visit: Payer: Self-pay

## 2019-12-13 DIAGNOSIS — B351 Tinea unguium: Secondary | ICD-10-CM

## 2019-12-13 DIAGNOSIS — M79674 Pain in right toe(s): Secondary | ICD-10-CM | POA: Diagnosis not present

## 2019-12-13 DIAGNOSIS — G4730 Sleep apnea, unspecified: Secondary | ICD-10-CM | POA: Diagnosis not present

## 2019-12-13 DIAGNOSIS — I10 Essential (primary) hypertension: Secondary | ICD-10-CM | POA: Diagnosis not present

## 2019-12-13 DIAGNOSIS — M79675 Pain in left toe(s): Secondary | ICD-10-CM | POA: Diagnosis not present

## 2019-12-13 DIAGNOSIS — F064 Anxiety disorder due to known physiological condition: Secondary | ICD-10-CM | POA: Diagnosis not present

## 2019-12-13 DIAGNOSIS — E1169 Type 2 diabetes mellitus with other specified complication: Secondary | ICD-10-CM | POA: Diagnosis not present

## 2019-12-15 DIAGNOSIS — I10 Essential (primary) hypertension: Secondary | ICD-10-CM | POA: Diagnosis not present

## 2019-12-15 NOTE — Progress Notes (Signed)
Subjective:   Patient ID: Mary Choi, female   DOB: 69 y.o.   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

## 2019-12-20 DIAGNOSIS — M6281 Muscle weakness (generalized): Secondary | ICD-10-CM | POA: Diagnosis not present

## 2019-12-20 DIAGNOSIS — M546 Pain in thoracic spine: Secondary | ICD-10-CM | POA: Diagnosis not present

## 2019-12-20 DIAGNOSIS — G894 Chronic pain syndrome: Secondary | ICD-10-CM | POA: Diagnosis not present

## 2020-02-02 DIAGNOSIS — H0102A Squamous blepharitis right eye, upper and lower eyelids: Secondary | ICD-10-CM | POA: Diagnosis not present

## 2020-02-02 DIAGNOSIS — H1045 Other chronic allergic conjunctivitis: Secondary | ICD-10-CM | POA: Diagnosis not present

## 2020-02-02 DIAGNOSIS — E119 Type 2 diabetes mellitus without complications: Secondary | ICD-10-CM | POA: Diagnosis not present

## 2020-02-02 DIAGNOSIS — H04123 Dry eye syndrome of bilateral lacrimal glands: Secondary | ICD-10-CM | POA: Diagnosis not present

## 2020-02-02 DIAGNOSIS — H25813 Combined forms of age-related cataract, bilateral: Secondary | ICD-10-CM | POA: Diagnosis not present

## 2020-02-02 DIAGNOSIS — H0102B Squamous blepharitis left eye, upper and lower eyelids: Secondary | ICD-10-CM | POA: Diagnosis not present

## 2020-03-13 DIAGNOSIS — R634 Abnormal weight loss: Secondary | ICD-10-CM | POA: Diagnosis not present

## 2020-03-13 DIAGNOSIS — E1169 Type 2 diabetes mellitus with other specified complication: Secondary | ICD-10-CM | POA: Diagnosis not present

## 2020-03-13 DIAGNOSIS — R7309 Other abnormal glucose: Secondary | ICD-10-CM | POA: Diagnosis not present

## 2020-03-13 DIAGNOSIS — E8889 Other specified metabolic disorders: Secondary | ICD-10-CM | POA: Diagnosis not present

## 2020-03-15 DIAGNOSIS — I10 Essential (primary) hypertension: Secondary | ICD-10-CM | POA: Diagnosis not present

## 2020-03-15 DIAGNOSIS — E1100 Type 2 diabetes mellitus with hyperosmolarity without nonketotic hyperglycemic-hyperosmolar coma (NKHHC): Secondary | ICD-10-CM | POA: Diagnosis not present

## 2020-05-29 ENCOUNTER — Ambulatory Visit (INDEPENDENT_AMBULATORY_CARE_PROVIDER_SITE_OTHER): Payer: Medicare PPO

## 2020-05-29 ENCOUNTER — Encounter: Payer: Self-pay | Admitting: Pulmonary Disease

## 2020-05-29 ENCOUNTER — Other Ambulatory Visit: Payer: Self-pay

## 2020-05-29 ENCOUNTER — Ambulatory Visit: Payer: Medicare PPO | Admitting: Pulmonary Disease

## 2020-05-29 VITALS — BP 126/72 | HR 116 | Temp 98.3°F | Ht 63.0 in | Wt 153.0 lb

## 2020-05-29 DIAGNOSIS — R0602 Shortness of breath: Secondary | ICD-10-CM

## 2020-05-29 DIAGNOSIS — R0600 Dyspnea, unspecified: Secondary | ICD-10-CM | POA: Diagnosis not present

## 2020-05-29 MED ORDER — FLUTICASONE FUROATE-VILANTEROL 200-25 MCG/INH IN AEPB: 1.0000 | INHALATION_SPRAY | Freq: Every day | RESPIRATORY_TRACT | 2 refills | Status: DC

## 2020-05-29 NOTE — Patient Instructions (Signed)
Shortness of breath/wheezing   Obtain chest x-ray Schedule PFT  Initiate Breo -To be used daily on a regular basis  Continue albuterol to be used as needed  Call with any other significant concerns  Follow-up in 3 months

## 2020-05-29 NOTE — Progress Notes (Signed)
ches

## 2020-05-29 NOTE — Progress Notes (Signed)
Subjective:    Patient ID: Mary Choi, female    DOB: 1951-03-03, 69 y.o.   MRN: [id number]  Patient being seen for shortness of breath   Reports exercise limit of about 2 blocks She does have albuterol which she has not been using on a regular basis  She reports wheezing now and then, she does have a cough with no significant production of sputum usually occasionally brings up yellow sputum  Has not been feeling acutely ill Denies a fever She does have occasional chest tightness  Denies symptoms of reflux  She did have a history of asthma  Non-smoker No pets  She was a Optometrist, worked in a bookstore  History of hypertension    Past Medical History:  Diagnosis Date  . Allergy   . Asthma   . Breast cancer ([place]) 1996  . Cancer ([place])   . Diabetes mellitus without complication ([place])   . Hypertension    Family History  Problem Relation Age of Onset  . Diabetes Mother   . Hypertension Mother    Non-smoker No pertinent occupational history No pets No recent significant travel   Review of Systems  Constitutional: Negative for fever and unexpected weight change.  HENT: Negative.   Respiratory: Negative for cough, chest tightness, shortness of breath and wheezing.   Gastrointestinal: Negative.   Genitourinary: Negative for dysuria.  Musculoskeletal: Negative.   Allergic/Immunologic: Positive for environmental allergies. Negative for food allergies and immunocompromised state.  Neurological: Negative for headaches.       Objective:   Physical Exam Constitutional:      Appearance: Normal appearance.  HENT:     Head: Normocephalic and atraumatic.  Eyes:     Extraocular Movements: Extraocular movements intact.     Pupils: Pupils are equal, round, and reactive to light.  Cardiovascular:     Rate and Rhythm: Regular rhythm.     Pulses: Normal pulses.     Heart sounds: Normal heart sounds. No murmur heard.   Pulmonary:     Effort: Pulmonary  effort is normal. No respiratory distress.     Breath sounds: Normal breath sounds. No stridor. No wheezing or rhonchi.  Musculoskeletal:     Cervical back: No tenderness. No muscular tenderness.  Neurological:     Mental Status: She is alert.    BP 126/72 (BP Location: Left Arm, Cuff Size: Normal)   Pulse (!) 116   Temp 98.3 F (36.8 C) (Oral)   Ht 5\' 3"  (1.6 m)   Wt 153 lb (69.4 kg)   SpO2 100%   BMI 27.10 kg/m   PFT-PFT from 2020 reviewed showing significant bronchodilator response in the small airways-the large airways were within normal limits. No significant obstruction or restriction  Echocardiogram with diastolic dysfunction, normal ejection fraction   Assessment & Plan:  .  Shortness of breath with exertion -She still does have some wheezing -She does have albuterol to use as needed -She has not been using the albuterol much  -Chest x-ray from 2020 shows no significant abnormalities peribronchial cuffing -PFT from 2020 was within normal limits   Plan . Obtain pulmonary function tests  . Obtain chest x-ray  . Continue graded exercises  . If any significant abnormality on chest x-ray then may benefit from a CT scan of the chest  . The importance of regular use of inhalers discussed with the patient  . We will initiate Breo, she is aware that this needs to be used on a daily  basis regardless of how she is feeling  . Follow-up in 3 months  . Encouraged to call with any significant concerns

## 2020-06-30 ENCOUNTER — Other Ambulatory Visit: Payer: Medicare PPO

## 2020-06-30 DIAGNOSIS — Z1231 Encounter for screening mammogram for malignant neoplasm of breast: Secondary | ICD-10-CM | POA: Diagnosis not present

## 2020-06-30 DIAGNOSIS — Z20822 Contact with and (suspected) exposure to covid-19: Secondary | ICD-10-CM | POA: Diagnosis not present

## 2020-07-02 DIAGNOSIS — R0602 Shortness of breath: Secondary | ICD-10-CM

## 2020-07-02 LAB — SARS-COV-2, NAA 2 DAY TAT

## 2020-07-02 LAB — NOVEL CORONAVIRUS, NAA: SARS-CoV-2, NAA: NOT DETECTED

## 2020-07-03 NOTE — Telephone Encounter (Signed)
Dr. Jenetta Downer, please see pt's mychart message and advise:  To: [facility]OL    From: FARHA DANO    Created: 07/02/2020 8:45 PM     *-*-*This message was handled on 07/03/2020 11:05 AM by Latiya Navia P*-*-*  just saw results on [facility] my chart.  Do I need to have something checked out with a CT? Not sure if I'm understanding what I read!   FYI.Marland KitchenMarland KitchenI decided not to use the Brio at this time. I am going to have the "breathing test" when I come back for 3 month appt. in February 2022, right?? Thank you.

## 2020-07-03 NOTE — Telephone Encounter (Signed)
Pt's CT order was placed and pt was notified of Dr. Judson Roch recommendations. Nothing further needed at this time.

## 2020-07-03 NOTE — Telephone Encounter (Signed)
Kindly schedule patient for CT chest with contrast  Reason: Right upper lobe nodular infiltrate

## 2020-07-04 ENCOUNTER — Telehealth: Payer: Self-pay | Admitting: Pulmonary Disease

## 2020-07-04 DIAGNOSIS — J4520 Mild intermittent asthma, uncomplicated: Secondary | ICD-10-CM

## 2020-07-04 NOTE — Telephone Encounter (Signed)
Dr. Ander Slade please advise what labs you would like [place] to order.  Thanks!

## 2020-07-06 DIAGNOSIS — R229 Localized swelling, mass and lump, unspecified: Secondary | ICD-10-CM | POA: Diagnosis not present

## 2020-07-06 DIAGNOSIS — Z853 Personal history of malignant neoplasm of breast: Secondary | ICD-10-CM | POA: Diagnosis not present

## 2020-07-06 NOTE — Telephone Encounter (Signed)
Order placed

## 2020-07-06 NOTE — Telephone Encounter (Signed)
bmp 

## 2020-07-07 ENCOUNTER — Other Ambulatory Visit: Payer: Self-pay | Admitting: Obstetrics & Gynecology

## 2020-07-07 DIAGNOSIS — R229 Localized swelling, mass and lump, unspecified: Secondary | ICD-10-CM

## 2020-07-10 DIAGNOSIS — I10 Essential (primary) hypertension: Secondary | ICD-10-CM | POA: Diagnosis not present

## 2020-07-10 NOTE — Telephone Encounter (Signed)
Called and spoke with patient.  She states she did see the response about the CT and Breo.  She has a recall placed for her PFT and OV with AO.  Patient has no other complaints or concerns.  Nothing further needed at this time.

## 2020-07-12 DIAGNOSIS — E782 Mixed hyperlipidemia: Secondary | ICD-10-CM | POA: Diagnosis not present

## 2020-07-12 DIAGNOSIS — D649 Anemia, unspecified: Secondary | ICD-10-CM | POA: Diagnosis not present

## 2020-07-12 DIAGNOSIS — D644 Congenital dyserythropoietic anemia: Secondary | ICD-10-CM | POA: Diagnosis not present

## 2020-07-12 DIAGNOSIS — E1169 Type 2 diabetes mellitus with other specified complication: Secondary | ICD-10-CM | POA: Diagnosis not present

## 2020-07-17 ENCOUNTER — Other Ambulatory Visit (INDEPENDENT_AMBULATORY_CARE_PROVIDER_SITE_OTHER): Payer: Medicare PPO

## 2020-07-17 DIAGNOSIS — J4520 Mild intermittent asthma, uncomplicated: Secondary | ICD-10-CM | POA: Diagnosis not present

## 2020-07-17 DIAGNOSIS — M40294 Other kyphosis, thoracic region: Secondary | ICD-10-CM | POA: Diagnosis not present

## 2020-07-17 DIAGNOSIS — M7918 Myalgia, other site: Secondary | ICD-10-CM | POA: Diagnosis not present

## 2020-07-17 DIAGNOSIS — M546 Pain in thoracic spine: Secondary | ICD-10-CM | POA: Diagnosis not present

## 2020-07-17 LAB — BASIC METABOLIC PANEL
BUN: 19 mg/dL (ref 6–23)
CO2: 28 mEq/L (ref 19–32)
Calcium: 9.6 mg/dL (ref 8.4–10.5)
Chloride: 100 mEq/L (ref 96–112)
Creatinine, Ser: 0.86 mg/dL (ref 0.40–1.20)
GFR: 67.84 mL/min (ref >60.00)
Glucose, Bld: 182 mg/dL — ABNORMAL HIGH (ref 70–99)
Potassium: 3.7 mEq/L (ref 3.5–5.1)
Sodium: 137 mEq/L (ref 135–145)

## 2020-07-19 ENCOUNTER — Ambulatory Visit (INDEPENDENT_AMBULATORY_CARE_PROVIDER_SITE_OTHER)
Admission: RE | Admit: 2020-07-19 | Discharge: 2020-07-19 | Disposition: A | Discharge status: Home / Self Care | Payer: Medicare PPO | Source: Ambulatory Visit | Attending: Pulmonary Disease | Admitting: Pulmonary Disease

## 2020-07-19 ENCOUNTER — Other Ambulatory Visit: Payer: Self-pay

## 2020-07-19 DIAGNOSIS — I700 Atherosclerosis of aorta: Secondary | ICD-10-CM | POA: Diagnosis not present

## 2020-07-19 DIAGNOSIS — M40204 Unspecified kyphosis, thoracic region: Secondary | ICD-10-CM | POA: Diagnosis not present

## 2020-07-19 DIAGNOSIS — Z853 Personal history of malignant neoplasm of breast: Secondary | ICD-10-CM | POA: Diagnosis not present

## 2020-07-19 DIAGNOSIS — R911 Solitary pulmonary nodule: Secondary | ICD-10-CM | POA: Diagnosis not present

## 2020-07-19 DIAGNOSIS — R0602 Shortness of breath: Secondary | ICD-10-CM

## 2020-07-19 MED ORDER — IOHEXOL 300 MG/ML  SOLN
80.0000 mL | Freq: Once | INTRAMUSCULAR | Status: AC | PRN
  Administered 2020-07-19: 80 mL via INTRAVENOUS

## 2020-08-10 ENCOUNTER — Other Ambulatory Visit: Payer: Self-pay

## 2020-08-10 ENCOUNTER — Ambulatory Visit
Admission: RE | Admit: 2020-08-10 | Discharge: 2020-08-10 | Disposition: A | Discharge status: Home / Self Care | Payer: Medicare PPO | Source: Ambulatory Visit | Attending: Obstetrics & Gynecology | Admitting: Obstetrics & Gynecology

## 2020-08-10 DIAGNOSIS — R229 Localized swelling, mass and lump, unspecified: Secondary | ICD-10-CM

## 2020-08-10 DIAGNOSIS — N6489 Other specified disorders of breast: Secondary | ICD-10-CM | POA: Diagnosis not present

## 2020-08-30 DIAGNOSIS — Z1212 Encounter for screening for malignant neoplasm of rectum: Secondary | ICD-10-CM | POA: Diagnosis not present

## 2020-08-30 DIAGNOSIS — Z1211 Encounter for screening for malignant neoplasm of colon: Secondary | ICD-10-CM | POA: Diagnosis not present

## 2020-09-06 LAB — EXTERNAL GENERIC LAB PROCEDURE: COLOGUARD: NEGATIVE

## 2020-09-06 LAB — COLOGUARD: COLOGUARD: NEGATIVE

## 2020-09-16 DIAGNOSIS — G4730 Sleep apnea, unspecified: Secondary | ICD-10-CM | POA: Diagnosis not present

## 2020-09-16 DIAGNOSIS — I10 Essential (primary) hypertension: Secondary | ICD-10-CM | POA: Diagnosis not present

## 2020-09-16 DIAGNOSIS — E1169 Type 2 diabetes mellitus with other specified complication: Secondary | ICD-10-CM | POA: Diagnosis not present

## 2020-09-18 DIAGNOSIS — E1169 Type 2 diabetes mellitus with other specified complication: Secondary | ICD-10-CM | POA: Diagnosis not present

## 2020-09-18 DIAGNOSIS — E782 Mixed hyperlipidemia: Secondary | ICD-10-CM | POA: Diagnosis not present

## 2020-09-18 DIAGNOSIS — G4730 Sleep apnea, unspecified: Secondary | ICD-10-CM | POA: Diagnosis not present

## 2020-09-18 DIAGNOSIS — D644 Congenital dyserythropoietic anemia: Secondary | ICD-10-CM | POA: Diagnosis not present

## 2020-09-21 ENCOUNTER — Encounter: Payer: Self-pay | Admitting: Gastroenterology

## 2020-09-26 DIAGNOSIS — G4730 Sleep apnea, unspecified: Secondary | ICD-10-CM | POA: Diagnosis not present

## 2020-10-31 ENCOUNTER — Other Ambulatory Visit: Payer: Self-pay

## 2020-10-31 ENCOUNTER — Ambulatory Visit (INDEPENDENT_AMBULATORY_CARE_PROVIDER_SITE_OTHER): Payer: Medicare PPO | Admitting: Gastroenterology

## 2020-10-31 ENCOUNTER — Encounter: Payer: Self-pay | Admitting: Gastroenterology

## 2020-10-31 VITALS — BP 120/78 | HR 80 | Ht 64.0 in | Wt 156.0 lb

## 2020-10-31 DIAGNOSIS — R634 Abnormal weight loss: Secondary | ICD-10-CM

## 2020-10-31 DIAGNOSIS — D509 Iron deficiency anemia, unspecified: Secondary | ICD-10-CM

## 2020-10-31 NOTE — Progress Notes (Addendum)
Chief Complaint:   Referring Provider:  Lucianne Lei, MD      ASSESSMENT AND PLAN;   #1. IDA  #2. Wt loss: 5lb over 1 yr.  Plan: -Colon for further evaluation.  Discussed risks and benefits. -If above is neg and/or further weight loss, CT AP, followed by EGD if needed.  We have decided to hold off on EGD at this time as she is not having any upper GI symptoms. -She will get blood work from Dr Criss Rosales. Has FU appt next week. -Fusion plus 1 tab after colon.  Samples were given.   HPI:    Mary Choi is a 70 y.o. 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  No nausea, vomiting, heartburn, regurgitation, odynophagia or dysphagia.  No significant diarrhea or constipation. No melena or hematochezia. No abdominal pain.  Neg cologuard.   No H/O pica  No hematuria or vaginal bleeding.  Note that she is S/P hysterectomy in the past.  Drinks sodas. No chocolates, chewing gums, artificial sweeteners and candy. No NSAIDs  LBP - T4 compression with thoracic kyphosis  On review of her labs 07/13/2020 Hb 9.9, MCV 85, ferritin 7, saturation 9%, TIBC 423, iron 37.  B12 and folate - Nl  Has lost weight as below Wt Readings from Last 3 Encounters:  10/31/20 156 lb (70.8 kg)  05/29/20 153 lb (69.4 kg)  11/29/19 161 lb 9.6 oz (73.3 kg)    Past Medical History:  Diagnosis Date  . Allergy   . Asthma   . Breast cancer ([place]) 1996  . Cancer ([place])   . Diabetes mellitus without complication ([place])   . Hypertension     Past Surgical History:  Procedure Laterality Date  . ABDOMINAL HYSTERECTOMY    . BREAST SURGERY    . CESAREAN SECTION    . MASTECTOMY Right   . ORIF ANKLE FRACTURE Right 01/31/2015   Procedure: OPEN REDUCTION INTERNAL FIXATION (ORIF) ANKLE FRACTURE;  Surgeon: Renette Butters, MD;  Location: [facility];  Service: Orthopedics;  Laterality: Right;    Family History  Problem Relation Age of Onset  . Diabetes Mother   . Hypertension Mother   . Heart disease Brother   . Pancreatic cancer Maternal Grandfather   . Colon cancer Neg Hx   . Esophageal cancer Neg Hx   . Liver cancer Neg Hx   . Rectal cancer Neg Hx     Social History   Tobacco Use  . Smoking status: Never Smoker  . Smokeless tobacco: Never Used  Substance Use Topics  . Alcohol use: Yes  . Drug use: No    Current Outpatient Medications  Medication Sig Dispense Refill  . albuterol (PROVENTIL HFA;VENTOLIN HFA) 108 (90 BASE) MCG/ACT inhaler Inhale 2 puffs into the lungs every 6 (six) hours as needed for wheezing.    Marland Kitchen amLODipine (NORVASC) 10 MG tablet Take 10 mg by mouth daily.    . fluticasone furoate-vilanterol (BREO ELLIPTA) 200-25 MCG/INH AEPB Inhale 1 puff into the lungs daily. 60 each 2  . Insulin Glargine 300 UNIT/ML SOPN Inject 23 Units into the skin at bedtime.    Marland Kitchen losartan-hydrochlorothiazide (HYZAAR) 100-25 MG per tablet Take 1 tablet by mouth daily.    . magic mouthwash SOLN Take 5 mLs by mouth 3 (  three) times daily as needed for mouth pain.    . metFORMIN (GLUCOPHAGE) 1000 MG tablet Take 1,000 mg by mouth at bedtime.     . pioglitazone (ACTOS) 15 MG tablet Take 15 mg by mouth every other day.     . rosuvastatin (CRESTOR) 10 MG tablet Take 10 mg by mouth daily.    . Semaglutide,0.25 or 0.5MG /DOS, 2 MG/1.5ML SOPN Ozempic 0.25 mg or 0.5 mg (2 mg/1.5 mL) subcutaneous pen injector     No current facility-administered medications for this visit.    No Known Allergies  Review of Systems:  Constitutional: Denies fever, chills, diaphoresis, appetite change and fatigue.  HEENT: Denies photophobia, eye pain, redness, hearing loss, ear pain, congestion, sore throat, rhinorrhea, sneezing, mouth sores, neck pain, neck stiffness and tinnitus.   Respiratory: Denies SOB, DOE, cough, chest tightness,   and wheezing.   Cardiovascular: Denies chest pain, palpitations and leg swelling.  Genitourinary: Denies dysuria, urgency, frequency, hematuria, flank pain and difficulty urinating.  Musculoskeletal: Denies myalgias, back pain, joint swelling, arthralgias and gait problem.  Skin: No rash.  Neurological: Denies dizziness, seizures, syncope, weakness, light-headedness, numbness and headaches.  Hematological: Denies adenopathy. Easy bruising, personal or family bleeding history  Psychiatric/Behavioral: No anxiety or depression     Physical Exam:    BP 120/78 (BP Location: Left Arm, Patient Position: Sitting, Cuff Size: Normal)   Pulse 80   Ht 5\' 4"  (1.626 m)   Wt 156 lb (70.8 kg)   BMI 26.78 kg/m  Wt Readings from Last 3 Encounters:  10/31/20 156 lb (70.8 kg)  05/29/20 153 lb (69.4 kg)  11/29/19 161 lb 9.6 oz (73.3 kg)   Constitutional:  Well-developed, in no acute distress. Psychiatric: Normal mood and affect. Behavior is normal. HEENT: Pupils normal.  Conjunctivae are normal. No scleral icterus. Neck supple.  Cardiovascular: Normal rate, regular rhythm. No edema Pulmonary/chest: Effort normal and breath sounds normal. No wheezing, rales or rhonchi. Abdominal: Soft, nondistended. Nontender. Bowel sounds active throughout. There are no masses palpable. No hepatomegaly. Rectal: Deferred Neurological: Alert and oriented to person place and time. Skin: Skin is warm and dry. No rashes noted.  Data Reviewed: I have personally reviewed following labs and imaging studies  CBC: CBC Latest Ref Rng & Units 01/25/2015 08/26/2009 08/26/2009  WBC 4.0 - 10.5 K/uL 8.4 - 5.7  Hemoglobin 12.0 - 15.0 g/dL 11.7(L) 12.9 11.5(L)  Hematocrit 36.0 - 46.0 % 36.9 38.0 36.2  Platelets 150 - 400 K/uL 222 - 173    CMP: CMP Latest Ref Rng & Units 07/17/2020 01/25/2015 08/26/2009  Glucose 70 - 99 mg/dL 182(H) 193(H) 124(H)  BUN 6 - 23 mg/dL 19 15 20   Creatinine 0.40 - 1.20 mg/dL 0.86 0.89 0.7  Sodium 135  - 145 mEq/L 137 138 141  Potassium 3.5 - 5.1 mEq/L 3.7 4.4 3.7  Chloride 96 - 112 mEq/L 100 101 108  CO2 19 - 32 mEq/L 28 28 -  Calcium 8.4 - 10.5 mg/dL 9.6 9.3 -  Total Protein 6.0 - 8.3 g/dL - - -  Total Bilirubin 0.3 - 1.2 mg/dL - - -  Alkaline Phos 39 - 117 U/L - - -  AST 0 - 37 U/L - - -  ALT 0 - 35 U/L - - -      Carmell Austria, MD 10/31/2020, 3:35 PM  Cc: Lucianne Lei, MD

## 2020-10-31 NOTE — Patient Instructions (Addendum)
If you are age 72 or older, your body mass index should be between 23-30. Your Body mass index is 26.78 kg/m. If this is out of the aforementioned range listed, please consider follow up with your Primary Care Provider.  If you are age 67 or younger, your body mass index should be between 19-25. Your Body mass index is 26.78 kg/m. If this is out of the aformentioned range listed, please consider follow up with your Primary Care Provider.   You have been scheduled for a colonoscopy. Please follow written instructions given to you at your visit today.  Please pick up your prep supplies at the pharmacy within the next 1-3 days. If you use inhalers (even only as needed), please bring them with you on the day of your procedure.  We have given you samples of fusion plus to start taking after the procedure.    Thank you,  Dr. Jackquline Denmark

## 2020-11-08 ENCOUNTER — Encounter: Payer: Self-pay | Admitting: Gastroenterology

## 2020-11-08 DIAGNOSIS — I10 Essential (primary) hypertension: Secondary | ICD-10-CM | POA: Diagnosis not present

## 2020-11-08 DIAGNOSIS — D2271 Melanocytic nevi of right lower limb, including hip: Secondary | ICD-10-CM | POA: Diagnosis not present

## 2020-11-08 DIAGNOSIS — E782 Mixed hyperlipidemia: Secondary | ICD-10-CM | POA: Diagnosis not present

## 2020-11-08 DIAGNOSIS — D2361 Other benign neoplasm of skin of right upper limb, including shoulder: Secondary | ICD-10-CM | POA: Diagnosis not present

## 2020-11-08 DIAGNOSIS — L821 Other seborrheic keratosis: Secondary | ICD-10-CM | POA: Diagnosis not present

## 2020-11-08 DIAGNOSIS — L819 Disorder of pigmentation, unspecified: Secondary | ICD-10-CM | POA: Diagnosis not present

## 2020-11-08 DIAGNOSIS — D235 Other benign neoplasm of skin of trunk: Secondary | ICD-10-CM | POA: Diagnosis not present

## 2020-11-08 DIAGNOSIS — D2261 Melanocytic nevi of right upper limb, including shoulder: Secondary | ICD-10-CM | POA: Diagnosis not present

## 2020-11-08 DIAGNOSIS — D2272 Melanocytic nevi of left lower limb, including hip: Secondary | ICD-10-CM | POA: Diagnosis not present

## 2020-11-08 DIAGNOSIS — E1169 Type 2 diabetes mellitus with other specified complication: Secondary | ICD-10-CM | POA: Diagnosis not present

## 2020-11-08 DIAGNOSIS — L918 Other hypertrophic disorders of the skin: Secondary | ICD-10-CM | POA: Diagnosis not present

## 2020-11-08 DIAGNOSIS — D2362 Other benign neoplasm of skin of left upper limb, including shoulder: Secondary | ICD-10-CM | POA: Diagnosis not present

## 2020-11-13 DIAGNOSIS — I10 Essential (primary) hypertension: Secondary | ICD-10-CM | POA: Diagnosis not present

## 2020-11-13 DIAGNOSIS — I972 Postmastectomy lymphedema syndrome: Secondary | ICD-10-CM | POA: Diagnosis not present

## 2020-11-13 DIAGNOSIS — D649 Anemia, unspecified: Secondary | ICD-10-CM | POA: Diagnosis not present

## 2020-11-13 DIAGNOSIS — R0602 Shortness of breath: Secondary | ICD-10-CM | POA: Diagnosis not present

## 2020-11-13 DIAGNOSIS — F321 Major depressive disorder, single episode, moderate: Secondary | ICD-10-CM | POA: Diagnosis not present

## 2020-11-13 DIAGNOSIS — Z0000 Encounter for general adult medical examination without abnormal findings: Secondary | ICD-10-CM | POA: Diagnosis not present

## 2020-11-13 DIAGNOSIS — E1169 Type 2 diabetes mellitus with other specified complication: Secondary | ICD-10-CM | POA: Diagnosis not present

## 2020-11-13 DIAGNOSIS — E782 Mixed hyperlipidemia: Secondary | ICD-10-CM | POA: Diagnosis not present

## 2020-11-14 ENCOUNTER — Other Ambulatory Visit: Payer: Self-pay

## 2020-11-14 ENCOUNTER — Ambulatory Visit (AMBULATORY_SURGERY_CENTER): Payer: Medicare PPO | Admitting: Gastroenterology

## 2020-11-14 ENCOUNTER — Encounter: Payer: Self-pay | Admitting: Gastroenterology

## 2020-11-14 VITALS — BP 140/66 | HR 87 | Temp 97.5°F | Resp 16 | Ht 64.0 in | Wt 156.0 lb

## 2020-11-14 DIAGNOSIS — K648 Other hemorrhoids: Secondary | ICD-10-CM

## 2020-11-14 DIAGNOSIS — Z1211 Encounter for screening for malignant neoplasm of colon: Secondary | ICD-10-CM | POA: Diagnosis not present

## 2020-11-14 DIAGNOSIS — D509 Iron deficiency anemia, unspecified: Secondary | ICD-10-CM | POA: Diagnosis not present

## 2020-11-14 DIAGNOSIS — K5730 Diverticulosis of large intestine without perforation or abscess without bleeding: Secondary | ICD-10-CM

## 2020-11-14 MED ORDER — SODIUM CHLORIDE 0.9 % IV SOLN: 500.0000 mL | Freq: Once | INTRAVENOUS | Status: DC

## 2020-11-14 NOTE — Patient Instructions (Signed)
Read all of the handouts given to you by your recovery room nurse.  YOU HAD AN ENDOSCOPIC PROCEDURE TODAY AT [facility]:   Refer to the procedure report that was given to you for any specific questions about what was found during the examination.  If the procedure report does not answer your questions, please call your gastroenterologist to clarify.  If you requested that your care partner not be given the details of your procedure findings, then the procedure report has been included in a sealed envelope for you to review at your convenience later.  YOU SHOULD EXPECT: Some feelings of bloating in the abdomen. Passage of more gas than usual.  Walking can help get rid of the air that was put into your GI tract during the procedure and reduce the bloating. If you had a lower endoscopy (such as a colonoscopy or flexible sigmoidoscopy) you may notice spotting of blood in your stool or on the toilet paper.  Please Note:  You might notice some irritation and congestion in your nose or some drainage.  This is from the oxygen used during your procedure.  There is no need for concern and it should clear up in a day or so.  SYMPTOMS TO REPORT IMMEDIATELY:   Following lower endoscopy (colonoscopy or flexible sigmoidoscopy):  Excessive amounts of blood in the stool  Significant tenderness or worsening of abdominal pains  Swelling of the abdomen that is new, acute  Fever of 100F or higher   For urgent or emergent issues, a gastroenterologist can be reached at any hour by calling [phone]. Do not use MyChart messaging for urgent concerns.    DIET:  We do recommend a small meal at first, but then you may proceed to your regular diet.  Drink plenty of fluids but you should avoid alcoholic beverages for 24 hours.  ACTIVITY:  You should plan to take it easy for the rest of today and you should NOT DRIVE or use heavy machinery until tomorrow (because of the sedation medicines used  during the test).    FOLLOW UP: Our staff will call the number listed on your records 48-72 hours following your procedure to check on you and address any questions or concerns that you may have regarding the information given to you following your procedure. If we do not reach you, we will leave a message.  We will attempt to reach you two times.  During this call, we will ask if you have developed any symptoms of COVID 19. If you develop any symptoms (ie: fever, flu-like symptoms, shortness of breath, cough etc.) before then, please call [phone].  If you test positive for Covid 19 in the 2 weeks post procedure, please call and report this information to [place].     SIGNATURES/CONFIDENTIALITY: You and/or your care partner have signed paperwork which will be entered into your electronic medical record.  These signatures attest to the fact that that the information above on your After Visit Summary has been reviewed and is understood.  Full responsibility of the confidentiality of this discharge information lies with you and/or your care-partner.

## 2020-11-14 NOTE — Progress Notes (Signed)
VS by CW. ?

## 2020-11-14 NOTE — Op Note (Signed)
[facility] Patient Name: Mary Choi Procedure Date: 11/14/2020 8:37 AM MRN: [id number] Endoscopist: Jackquline Denmark , MD Age: 70 Referring MD:  Date of Birth: June 06, 1951 Gender: Female Account #: [id number] Procedure:                Colonoscopy Indications:              Iron deficiency anemia Medicines:                Monitored Anesthesia Care Procedure:                Pre-Anesthesia Assessment:                           - Prior to the procedure, a History and Physical                            was performed, and patient medications and                            allergies were reviewed. The patient's tolerance of                            previous anesthesia was also reviewed. The risks                            and benefits of the procedure and the sedation                            options and risks were discussed with the patient.                            All questions were answered, and informed consent                            was obtained. Prior Anticoagulants: The patient has                            taken no previous anticoagulant or antiplatelet                            agents. ASA Grade Assessment: II - A patient with                            mild systemic disease. After reviewing the risks                            and benefits, the patient was deemed in                            satisfactory condition to undergo the procedure.                           After obtaining informed consent, the colonoscope  was passed under direct vision. Throughout the                            procedure, the patient's blood pressure, pulse, and                            oxygen saturations were monitored continuously. The                            Olympus PCF-H190DL ([id number]) Colonoscope was                            introduced through the anus and advanced to the the                            cecum, identified by appendiceal orifice  and                            ileocecal valve. The colonoscopy was somewhat                            difficult due to a tortuous colon. Successful                            completion of the procedure was aided by applying                            abdominal pressure. The patient tolerated the                            procedure well. The quality of the bowel                            preparation was good. The ileocecal valve,                            appendiceal orifice, and rectum were photographed.                            TI could not be intubated d/t highly redundant                            colon/anatomy. Scope In: 8:56:06 AM Scope Out: 9:15:35 AM Scope Withdrawal Time: 0 hours 8 minutes 1 second  Total Procedure Duration: 0 hours 19 minutes 29 seconds  Findings:                 A few small-mouthed diverticula were found in the                            sigmoid colon and ascending colon.                           Non-bleeding internal hemorrhoids were found during  retroflexion. The hemorrhoids were small.                           The exam was otherwise without abnormality on                            direct and retroflexion views. Complications:            No immediate complications. Estimated Blood Loss:     Estimated blood loss: none. Impression:               - Mild pancolonic diverticulosis.                           - Non-bleeding internal hemorrhoids.                           - The examination was otherwise normal on direct                            and retroflexion views.                           - No specimens collected. Recommendation:           - Patient has a contact number available for                            emergencies. The signs and symptoms of potential                            delayed complications were discussed with the                            patient. Return to normal activities tomorrow.                             Written discharge instructions were provided to the                            patient.                           - Resume previous diet.                           - Continue present medications.                           - Repeat colonoscopy is not recommended for                            screening purposes.                           - Return to GI clinic in 8 weeks. If still with  problems, further work-up.                           - The findings and recommendations were discussed                            with the patient's family. Jackquline Denmark, MD 11/14/2020 9:20:15 AM This report has been signed electronically.

## 2020-11-16 ENCOUNTER — Telehealth: Payer: Self-pay | Admitting: *Deleted

## 2020-11-16 ENCOUNTER — Telehealth: Payer: Self-pay

## 2020-11-16 NOTE — Telephone Encounter (Signed)
  Follow up Call-  Call back number 11/14/2020  Post procedure Call Back phone  # [phone]  Permission to leave phone message Yes  Some recent data might be hidden     Patient questions:  Do you have a fever, pain , or abdominal swelling? No. Pain Score  0 *  Have you tolerated food without any problems? Yes.    Have you been able to return to your normal activities? Yes.    Do you have any questions about your discharge instructions: Diet   No. Medications  No. Follow up visit  No.  Do you have questions or concerns about your Care? No.  Actions: * If pain score is 4 or above: No action needed, pain <4.  1. Have you developed a fever since your procedure? no  2.   Have you had an respiratory symptoms (SOB or cough) since your procedure? no  3.   Have you tested positive for COVID 19 since your procedure no  4.   Have you had any family members/close contacts diagnosed with the COVID 19 since your procedure?  no   If yes to any of these questions please route to Joylene John, RN and Joella Prince, RN

## 2020-11-16 NOTE — Telephone Encounter (Signed)
Left message on follow up call. 

## 2020-11-20 ENCOUNTER — Telehealth: Payer: Self-pay | Admitting: Gastroenterology

## 2020-11-20 NOTE — Telephone Encounter (Signed)
Can take it with or without food x 8 weeks RG

## 2020-11-20 NOTE — Telephone Encounter (Signed)
Dr Lyndel Safe for this patient and her fusion iron tablets will she be taking it once a day with or without food and for how long should be on the iron supplement?

## 2020-11-21 MED ORDER — FUSION PLUS PO CAPS: 1.0000 | ORAL_CAPSULE | Freq: Every day | ORAL | 0 refills | Status: DC

## 2020-11-21 NOTE — Telephone Encounter (Signed)
LVM for patient to call back to be told this

## 2020-11-21 NOTE — Telephone Encounter (Signed)
LVM that script was sent to pharmacy and that it is over the counter and its not a preferred iron supplement on her insurance

## 2020-11-25 DIAGNOSIS — Z03818 Encounter for observation for suspected exposure to other biological agents ruled out: Secondary | ICD-10-CM | POA: Diagnosis not present

## 2020-12-07 ENCOUNTER — Ambulatory Visit: Payer: Medicare PPO | Admitting: Cardiology

## 2020-12-07 ENCOUNTER — Encounter: Payer: Self-pay | Admitting: Cardiology

## 2020-12-07 ENCOUNTER — Other Ambulatory Visit: Payer: Self-pay

## 2020-12-07 VITALS — BP 128/75 | HR 88 | Temp 96.4°F | Resp 17 | Ht 64.0 in | Wt 154.4 lb

## 2020-12-07 DIAGNOSIS — R0602 Shortness of breath: Secondary | ICD-10-CM | POA: Diagnosis not present

## 2020-12-07 DIAGNOSIS — I700 Atherosclerosis of aorta: Secondary | ICD-10-CM

## 2020-12-07 DIAGNOSIS — Z794 Long term (current) use of insulin: Secondary | ICD-10-CM

## 2020-12-07 DIAGNOSIS — I10 Essential (primary) hypertension: Secondary | ICD-10-CM | POA: Diagnosis not present

## 2020-12-07 DIAGNOSIS — E119 Type 2 diabetes mellitus without complications: Secondary | ICD-10-CM | POA: Diagnosis not present

## 2020-12-07 DIAGNOSIS — E7800 Pure hypercholesterolemia, unspecified: Secondary | ICD-10-CM

## 2020-12-07 MED ORDER — ASPIRIN 81 MG PO CHEW: 81.0000 mg | CHEWABLE_TABLET | Freq: Every day | ORAL | Status: DC

## 2020-12-07 NOTE — Progress Notes (Signed)
Primary Physician/Referring:  Lucianne Lei, MD  Patient ID: Mary Choi, female    DOB: 1950/11/01, 70 y.o.   MRN: [id number]  Chief Complaint  Patient presents with  . Shortness of Breath  . New Patient (Initial Visit)   HPI:    Mary Choi  is a 70 y.o. African-American female patient with hypertension, hyperlipidemia, controlled diabetes mellitus without complication, mild reactive airway disease who has been complaining of worsening shortness of breath that started about 6 to 7 months ago.  She has had pulmonary evaluation and no absolute etiology was found.  In view of multiple cardiovascular risk factors, and worsening symptoms of dyspnea, she is now referred for cardiac evaluation and restratification.  She has history of history of breast cancer in situ SP mastectomy, there was no chemo or radiation therapy.  She is unable to do routine activities including just walking very short distances without having to stop due to marked dyspnea.  No other associated chest pain or palpitations.  She follows medical advice very carefully.  Past Medical History:  Diagnosis Date  . Allergy   . Anemia   . Asthma   . Breast cancer ([place]) 1996  . Cancer ([place])   . Diabetes mellitus without complication ([place])   . Hyperlipidemia   . Hypertension    Past Surgical History:  Procedure Laterality Date  . ABDOMINAL HYSTERECTOMY    . BREAST SURGERY    . CESAREAN SECTION    . MASTECTOMY Right   . ORIF ANKLE FRACTURE Right 01/31/2015   Procedure: OPEN REDUCTION INTERNAL FIXATION (ORIF) ANKLE FRACTURE;  Surgeon: Renette Butters, MD;  Location: [place];  Service: Orthopedics;  Laterality: Right;   Family History  Problem Relation Age of Onset  . Diabetes Mother   . Hypertension Mother   . Heart disease Brother   . Pancreatic cancer Maternal Grandfather   . Colon cancer Neg Hx   . Esophageal cancer Neg Hx   . Liver cancer Neg Hx   . Rectal cancer Neg Hx   . Stomach  cancer Neg Hx     Social History   Tobacco Use  . Smoking status: Never Smoker  . Smokeless tobacco: Never Used  Substance Use Topics  . Alcohol use: Yes    Comment: social    Marital Status: Widowed  ROS  Review of Systems  Cardiovascular: Positive for dyspnea on exertion. Negative for chest pain and leg swelling.  Gastrointestinal: Negative for melena.   Objective  Blood pressure 128/75, pulse 88, temperature (!) 96.4 F (35.8 C), temperature source Temporal, resp. rate 17, height '5\' 4"'  (1.626 m), weight 154 lb 6.4 oz (70 kg), SpO2 97 %. Body mass index is 26.5 kg/m.  Vitals with BMI 12/07/2020 11/14/2020 11/14/2020  Height '5\' 4"'  - -  Weight 154 lbs 6 oz - -  BMI 35.46 - -  Systolic 568 127 517  Diastolic 75 66 60  Pulse 88 - -     Physical Exam  Constitutional: No distress.  Eyes: Conjunctivae are normal.  Neck: No JVD present.  Cardiovascular: Regular rhythm, intact distal pulses and normal pulses. Exam reveals no gallop.  Murmur heard.  Crescendo midsystolic murmur is present with a grade of 3/6 at the lower left sternal border and apex. Pulmonary/Chest: Effort normal and breath sounds normal. She exhibits no tenderness.  Abdominal: Soft. Bowel sounds are normal.  Musculoskeletal:        General: No edema. Normal range of  motion.     Cervical back: Neck supple.  Neurological: She is alert and oriented to person, place, and time.  Skin: Skin is warm.     Laboratory examination:   Recent Labs    07/17/20 1121  NA 137  K 3.7  CL 100  CO2 28  GLUCOSE 182*  BUN 19  CREATININE 0.86  CALCIUM 9.6   CrCl cannot be calculated (Patient's most recent lab result is older than the maximum 21 days allowed.).  CMP Latest Ref Rng & Units 07/17/2020 01/25/2015 08/26/2009  Glucose 70 - 99 mg/dL 182(H) 193(H) 124(H)  BUN 6 - 23 mg/dL '19 15 20  ' Creatinine 0.40 - 1.20 mg/dL 0.86 0.89 0.7  Sodium 135 - 145 mEq/L 137 138 141  Potassium 3.5 - 5.1 mEq/L 3.7 4.4 3.7  Chloride  96 - 112 mEq/L 100 101 108  CO2 19 - 32 mEq/L 28 28 -  Calcium 8.4 - 10.5 mg/dL 9.6 9.3 -  Total Protein 6.0 - 8.3 g/dL - - -  Total Bilirubin 0.3 - 1.2 mg/dL - - -  Alkaline Phos 39 - 117 U/L - - -  AST 0 - 37 U/L - - -  ALT 0 - 35 U/L - - -   External labs:   Labs 11/08/2020:  Total cholesterol 166, triglycerides 43, HDL 82, LDL 72.  Non-HDL cholesterol 84.  Serum glucose 126 mg, BUN 19, creatinine 0.79, EGFR 87 mL.  Sodium 141, potassium 4.1, AST 50, ALT 32, mildly elevated.  Otherwise CMP normal.  Hb 9.6/HCT 32.7, platelets 129.  Normal indicis.  A1c 7.1%.  Medications and allergies  No Known Allergies   Current Outpatient Medications on File Prior to Visit  Medication Sig Dispense Refill  . albuterol (PROVENTIL HFA;VENTOLIN HFA) 108 (90 BASE) MCG/ACT inhaler Inhale 2 puffs into the lungs every 6 (six) hours as needed for wheezing.    Marland Kitchen amLODipine (NORVASC) 10 MG tablet Take 10 mg by mouth daily.    . baclofen (LIORESAL) 10 MG tablet Take 10 mg by mouth 3 (three) times daily.    . cholecalciferol (VITAMIN D3) 25 MCG (1000 UNIT) tablet Take 1,000 Units by mouth daily.    . citalopram (CELEXA) 10 MG tablet Take 10 mg by mouth daily.    . insulin glargine, 2 Unit Dial, (TOUJEO MAX SOLOSTAR) 300 UNIT/ML Solostar Pen Inject 10 Units into the skin at bedtime.    . Iron-FA-B Cmp-C-Biot-Probiotic (FUSION PLUS) CAPS Take 1 capsule by mouth daily in the afternoon. For 8 weeks 60 capsule 0  . losartan-hydrochlorothiazide (HYZAAR) 100-25 MG per tablet Take 1 tablet by mouth daily.    . metFORMIN (GLUCOPHAGE) 1000 MG tablet Take 1,000 mg by mouth at bedtime.     . pioglitazone (ACTOS) 15 MG tablet Take 15 mg by mouth every other day.     . Potassium Chloride Crys ER (KLOR-CON M20 PO) Take 1 tablet by mouth daily at 12 noon.    . rosuvastatin (CRESTOR) 10 MG tablet Take 10 mg by mouth daily.    . Semaglutide,0.25 or 0.5MG/DOS, 2 MG/1.5ML SOPN Ozempic 0.25 mg or 0.5 mg (2 mg/1.5 mL)  subcutaneous pen injector     No current facility-administered medications on file prior to visit.     Radiology:   Colonoscopy 11/14/2020: - Mild pancolonic diverticulosis.                           - Non-bleeding internal  hemorrhoids.                           - The examination was otherwise normal on direct                            and retroflexion views.  CT scan of the chest 11/18/2019: No significant extracardiac abnormality. Normal heart size, mitral annular calcification present.  Mild atherosclerotic calcification of the proximal descending thoracic aorta.  Tortuous right subclavian artery.  This explains for focal nodular opacity seen on chest x-ray.   Cardiac Studies:   None EKG:     EKG 12/07/2020: Normal sinus rhythm at rate of 83 bpm, left atrial enlargement, left axis deviation, left intrafascicular block.  Anteroseptal infarct old.  IVCD, borderline criteria for LVH.  No significant change from EKG 10/10/1998: Normal sinus rhythm at rate of 95 bpm, left axis deviation, left anterior fascicular block.  Poor R wave progression, cannot exclude anteroseptal infarct old.  Assessment     ICD-10-CM   1. SOB (shortness of breath)  R06.02 PCV ECHOCARDIOGRAM COMPLETE    PCV MYOCARDIAL PERFUSION WO LEXISCAN  2. Primary hypertension  I10 EKG 12-Lead  3. Type 2 diabetes mellitus without complication, with long-term current use of insulin (HCC)  E11.9 PCV ECHOCARDIOGRAM COMPLETE   Z79.4 PCV MYOCARDIAL PERFUSION WO LEXISCAN  4. Hypercholesteremia  E78.00   5. Aortic atherosclerosis (HCC)  I70.0 aspirin (ASPIRIN CHILDRENS) 81 MG chewable tablet     Medications Discontinued During This Encounter  Medication Reason  . magic mouthwash SOLN Error    Meds ordered this encounter  Medications  . aspirin (ASPIRIN CHILDRENS) 81 MG chewable tablet    Sig: Chew 1 tablet (81 mg total) by mouth daily.   Orders Placed This Encounter  Procedures  . PCV MYOCARDIAL PERFUSION WO  LEXISCAN    Standing Status:   Future    Standing Expiration Date:   02/06/2021  . EKG 12-Lead  . PCV ECHOCARDIOGRAM COMPLETE    Standing Status:   Future    Standing Expiration Date:   12/07/2021   Recommendations:   Mary Choi is a 70 y.o. African-American female patient with hypertension, hyperlipidemia, controlled diabetes mellitus without complication, mild reactive airway disease who has been complaining of worsening shortness of breath that started about 6 to 7 months ago.  She has had pulmonary evaluation and no absolute etiology was found.  In view of multiple cardiovascular risk factors, and worsening symptoms of dyspnea, she is now referred for cardiac evaluation and restratification.  She has history of history of breast cancer in situ SP mastectomy, there was no chemo or radiation therapy.  Her physical examination except for midsystolic murmur at the apex is within normal limits including vascular examination.  Suspect she has hyperdynamic LVEF.  She needs an echocardiogram to evaluate the murmur and also dyspnea.  In view of high risk for CV events and her CV risk is >20%, we will schedule her for exercise Myoview stress test as well.  I would like to see her back after the test and I will make further recommendations.  At this point she is on best medical therapy and did not make any changes to her medication.  She does have mild aortic atherosclerosis in the descending thoracic aorta but in view of her age, diabetes mellitus, expect some atheroscleroticchanges, will start ASA 81 mg daily and also  her CV risk is >10%.   Adrian Prows, MD, [facility] 12/07/2020, 3:27 PM Office: [phone]

## 2020-12-14 DIAGNOSIS — I10 Essential (primary) hypertension: Secondary | ICD-10-CM | POA: Diagnosis not present

## 2020-12-14 DIAGNOSIS — E1169 Type 2 diabetes mellitus with other specified complication: Secondary | ICD-10-CM | POA: Diagnosis not present

## 2020-12-18 DIAGNOSIS — E1165 Type 2 diabetes mellitus with hyperglycemia: Secondary | ICD-10-CM | POA: Diagnosis not present

## 2020-12-18 DIAGNOSIS — I10 Essential (primary) hypertension: Secondary | ICD-10-CM | POA: Diagnosis not present

## 2020-12-18 DIAGNOSIS — F064 Anxiety disorder due to known physiological condition: Secondary | ICD-10-CM | POA: Diagnosis not present

## 2020-12-18 DIAGNOSIS — E782 Mixed hyperlipidemia: Secondary | ICD-10-CM | POA: Diagnosis not present

## 2020-12-19 ENCOUNTER — Other Ambulatory Visit: Payer: Self-pay

## 2020-12-19 ENCOUNTER — Ambulatory Visit: Payer: Medicare PPO

## 2020-12-19 DIAGNOSIS — R0602 Shortness of breath: Secondary | ICD-10-CM

## 2020-12-19 DIAGNOSIS — Z794 Long term (current) use of insulin: Secondary | ICD-10-CM | POA: Diagnosis not present

## 2020-12-19 DIAGNOSIS — E119 Type 2 diabetes mellitus without complications: Secondary | ICD-10-CM | POA: Diagnosis not present

## 2020-12-20 NOTE — Progress Notes (Signed)
Echocardiogram 12/19/2020: Left ventricle cavity is normal in size. Mild concentric hypertrophy of the left ventricle. Normal global wall motion. Normal LV systolic function with EF 59%. Doppler evidence of grade I (impaired) diastolic dysfunction, normal LAP.  Mild (Grade I) mitral regurgitation. Mild to moderate tricuspid regurgitation. Estimated pulmonary artery systolic pressure 26 mmHg.

## 2020-12-22 ENCOUNTER — Telehealth: Payer: Self-pay | Admitting: Gastroenterology

## 2020-12-22 MED ORDER — FUSION PLUS PO CAPS: 1.0000 | ORAL_CAPSULE | Freq: Every day | ORAL | 11 refills | Status: AC

## 2020-12-22 NOTE — Telephone Encounter (Signed)
PT called and wanted to know if she can get her Fusion Plus refilled or if she can just buy it OTC. Please give her a call. Thank you

## 2020-12-22 NOTE — Telephone Encounter (Signed)
Script sent to pharmacy. LVM for patient to know this

## 2020-12-27 ENCOUNTER — Other Ambulatory Visit: Payer: Self-pay

## 2020-12-27 ENCOUNTER — Ambulatory Visit: Payer: Medicare PPO

## 2020-12-27 DIAGNOSIS — Z794 Long term (current) use of insulin: Secondary | ICD-10-CM

## 2020-12-27 DIAGNOSIS — E119 Type 2 diabetes mellitus without complications: Secondary | ICD-10-CM

## 2020-12-27 DIAGNOSIS — R0602 Shortness of breath: Secondary | ICD-10-CM

## 2020-12-28 NOTE — Progress Notes (Signed)
Exercise tetrofosmin stress test  12/27/2020: Normal ECG stress. The patient exercised for 4 minutes and 28 seconds of a Bruce protocol, achieving approximately 6.41 METs.  The calculated Duke Treadmill Score is 4.47. Normal BP response. Exercise capacity reduced. Myocardial perfusion is normal. Overall LV systolic function is normal without regional wall motion abnormalities. Stress LV EF: 74%.  No previous exam available for comparison. Low risk.

## 2021-01-18 DIAGNOSIS — Z794 Long term (current) use of insulin: Secondary | ICD-10-CM | POA: Diagnosis not present

## 2021-01-18 DIAGNOSIS — E119 Type 2 diabetes mellitus without complications: Secondary | ICD-10-CM | POA: Diagnosis not present

## 2021-01-18 DIAGNOSIS — I10 Essential (primary) hypertension: Secondary | ICD-10-CM | POA: Diagnosis not present

## 2021-01-18 DIAGNOSIS — R0602 Shortness of breath: Secondary | ICD-10-CM | POA: Diagnosis not present

## 2021-01-18 DIAGNOSIS — I700 Atherosclerosis of aorta: Secondary | ICD-10-CM | POA: Diagnosis not present

## 2021-01-19 ENCOUNTER — Other Ambulatory Visit: Payer: Self-pay

## 2021-01-19 ENCOUNTER — Ambulatory Visit: Payer: Medicare PPO | Admitting: Cardiology

## 2021-01-19 ENCOUNTER — Encounter: Payer: Self-pay | Admitting: Cardiology

## 2021-01-19 VITALS — BP 133/73 | HR 103 | Temp 96.5°F | Resp 17 | Ht 64.0 in | Wt 151.4 lb

## 2021-01-19 DIAGNOSIS — R0602 Shortness of breath: Secondary | ICD-10-CM

## 2021-01-19 DIAGNOSIS — I10 Essential (primary) hypertension: Secondary | ICD-10-CM

## 2021-01-19 DIAGNOSIS — I700 Atherosclerosis of aorta: Secondary | ICD-10-CM

## 2021-01-19 DIAGNOSIS — Z794 Long term (current) use of insulin: Secondary | ICD-10-CM

## 2021-01-19 DIAGNOSIS — E119 Type 2 diabetes mellitus without complications: Secondary | ICD-10-CM

## 2021-01-19 NOTE — Progress Notes (Signed)
Primary Physician/Referring:  Lucianne Lei, MD  Patient ID: Mary Choi, female    DOB: Sep 03, 1950, 70 y.o.   MRN: [id number]  Chief Complaint  Patient presents with   Follow-up   Shortness of Breath   HPI:    Mary Choi  is a 70 y.o. African-American female patient with hypertension, hyperlipidemia, controlled diabetes mellitus without complication, mild reactive airway disease who has been complaining of worsening shortness of breath that started about 6 to 7 months ago.  She has had pulmonary evaluation and no absolute etiology was found.  In view of multiple cardiovascular risk factors, and worsening symptoms of dyspnea, she is now referred for cardiac evaluation and restratification.  She has history of history of breast cancer in situ SP mastectomy, there was no chemo or radiation therapy.  She is unable to do routine activities including just walking very short distances without having to stop due to marked dyspnea.  No other associated chest pain or palpitations.  She follows medical advice very carefully.  She underwent stress test and echocardiogram presents for follow-up, states that no new symptomatology.  Past Medical History:  Diagnosis Date   Allergy    Anemia    Asthma    Breast cancer ([place]) 1996   Cancer ([place])    Diabetes mellitus without complication ([place])    Hyperlipidemia    Hypertension    Past Surgical History:  Procedure Laterality Date   ABDOMINAL HYSTERECTOMY     BREAST SURGERY     CESAREAN SECTION     MASTECTOMY Right    ORIF ANKLE FRACTURE Right 01/31/2015   Procedure: OPEN REDUCTION INTERNAL FIXATION (ORIF) ANKLE FRACTURE;  Surgeon: Renette Butters, MD;  Location: [place];  Service: Orthopedics;  Laterality: Right;   Family History  Problem Relation Age of Onset   Diabetes Mother    Hypertension Mother    Heart disease Brother    Pancreatic cancer Maternal Grandfather    Colon cancer Neg Hx    Esophageal cancer Neg Hx     Liver cancer Neg Hx    Rectal cancer Neg Hx    Stomach cancer Neg Hx     Social History   Tobacco Use   Smoking status: Never   Smokeless tobacco: Never  Substance Use Topics   Alcohol use: Yes    Comment: social    Marital Status: Widowed  ROS  Review of Systems  Cardiovascular:  Positive for dyspnea on exertion. Negative for chest pain and leg swelling.  Gastrointestinal:  Negative for melena.  Objective  Blood pressure 133/73, pulse (!) 103, temperature (!) 96.5 F (35.8 C), temperature source Temporal, resp. rate 17, height '5\' 4"'  (1.626 m), weight 151 lb 6.4 oz (68.7 kg), SpO2 98 %. Body mass index is 25.99 kg/m.  Vitals with BMI 01/19/2021 12/07/2020 11/14/2020  Height '5\' 4"'  '5\' 4"'  -  Weight 151 lbs 6 oz 154 lbs 6 oz -  BMI 32.44 01.02 -  Systolic 725 366 440  Diastolic 73 75 66  Pulse 347 88 -     Physical Exam  Constitutional: No distress.  Eyes: Conjunctivae are normal.  Neck: No JVD present.  Cardiovascular: Regular rhythm, intact distal pulses and normal pulses. Exam reveals no gallop.  Murmur heard. Crescendo midsystolic murmur is present with a grade of 3/6 at the lower left sternal border and apex. Pulmonary/Chest: Effort normal and breath sounds normal. She exhibits no tenderness.  Abdominal: Soft. Bowel sounds are normal.  Musculoskeletal:        General: No edema. Normal range of motion.     Cervical back: Neck supple.  Neurological: She is alert and oriented to person, place, and time.  Skin: Skin is warm.    Laboratory examination:   Recent Labs    07/17/20 1121  NA 137  K 3.7  CL 100  CO2 28  GLUCOSE 182*  BUN 19  CREATININE 0.86  CALCIUM 9.6   CrCl cannot be calculated (Patient's most recent lab result is older than the maximum 21 days allowed.).  CMP Latest Ref Rng & Units 07/17/2020 01/25/2015 08/26/2009  Glucose 70 - 99 mg/dL 182(H) 193(H) 124(H)  BUN 6 - 23 mg/dL '19 15 20  ' Creatinine 0.40 - 1.20 mg/dL 0.86 0.89 0.7  Sodium 135 - 145  mEq/L 137 138 141  Potassium 3.5 - 5.1 mEq/L 3.7 4.4 3.7  Chloride 96 - 112 mEq/L 100 101 108  CO2 19 - 32 mEq/L 28 28 -  Calcium 8.4 - 10.5 mg/dL 9.6 9.3 -  Total Protein 6.0 - 8.3 g/dL - - -  Total Bilirubin 0.3 - 1.2 mg/dL - - -  Alkaline Phos 39 - 117 U/L - - -  AST 0 - 37 U/L - - -  ALT 0 - 35 U/L - - -   External labs:   Labs 11/08/2020:  Total cholesterol 166, triglycerides 43, HDL 82, LDL 72.  Non-HDL cholesterol 84.  Serum glucose 126 mg, BUN 19, creatinine 0.79, EGFR 87 mL.  Sodium 141, potassium 4.1, AST 50, ALT 32, mildly elevated.  Otherwise CMP normal.  Hb 9.6/HCT 32.7, platelets 129.  Normal indicis.  A1c 7.1%.  Medications and allergies  No Known Allergies   Current Outpatient Medications on File Prior to Visit  Medication Sig Dispense Refill   albuterol (PROVENTIL HFA;VENTOLIN HFA) 108 (90 BASE) MCG/ACT inhaler Inhale 2 puffs into the lungs every 6 (six) hours as needed for wheezing.     amLODipine (NORVASC) 10 MG tablet Take 10 mg by mouth daily.     aspirin (ASPIRIN CHILDRENS) 81 MG chewable tablet Chew 1 tablet (81 mg total) by mouth daily.     baclofen (LIORESAL) 10 MG tablet Take 10 mg by mouth 3 (three) times daily.     cholecalciferol (VITAMIN D3) 25 MCG (1000 UNIT) tablet Take 1,000 Units by mouth daily.     citalopram (CELEXA) 10 MG tablet Take 10 mg by mouth daily.     insulin glargine, 2 Unit Dial, (TOUJEO MAX SOLOSTAR) 300 UNIT/ML Solostar Pen Inject 10 Units into the skin at bedtime.     Iron-FA-B Cmp-C-Biot-Probiotic (FUSION PLUS) CAPS Take 1 capsule by mouth daily in the afternoon. 30 capsule 11   losartan-hydrochlorothiazide (HYZAAR) 100-25 MG per tablet Take 1 tablet by mouth daily.     metFORMIN (GLUCOPHAGE) 1000 MG tablet Take 1,000 mg by mouth at bedtime.      pioglitazone (ACTOS) 15 MG tablet Take 15 mg by mouth every other day.      Potassium Chloride Crys ER (KLOR-CON M20 PO) Take 1 tablet by mouth daily at 12 noon.     rosuvastatin  (CRESTOR) 10 MG tablet Take 10 mg by mouth daily.     Semaglutide,0.25 or 0.5MG/DOS, 2 MG/1.5ML SOPN Ozempic 0.25 mg or 0.5 mg (2 mg/1.5 mL) subcutaneous pen injector     No current facility-administered medications on file prior to visit.     Radiology:   Colonoscopy 11/14/2020: - Mild pancolonic  diverticulosis.                           - Non-bleeding internal hemorrhoids.                           - The examination was otherwise normal on direct                            and retroflexion views.  CT scan of the chest 11/18/2019: No significant extracardiac abnormality. Normal heart size, mitral annular calcification present.  Mild atherosclerotic calcification of the proximal descending thoracic aorta.  Tortuous right subclavian artery.  This explains for focal nodular opacity seen on chest x-ray.   Cardiac Studies:  PCV ECHOCARDIOGRAM COMPLETE 12/19/2020 Left ventricle cavity is normal in size. Mild concentric hypertrophy of the left ventricle. Normal global wall motion. Normal LV systolic function with EF 59%. Doppler evidence of grade I (impaired) diastolic dysfunction, normal LAP. Mild (Grade I) mitral regurgitation. Mild to moderate tricuspid regurgitation. Estimated pulmonary artery systolic pressure 26 mmHg.  Exercise tetrofosmin stress test  12/27/2020: Normal ECG stress. The patient exercised for 4 minutes and 28 seconds of a Bruce protocol, achieving approximately 6.41 METs.  The calculated Duke Treadmill Score is 4.47. Normal BP response. Exercise capacity reduced. Myocardial perfusion is normal. Overall LV systolic function is normal without regional wall motion abnormalities. Stress LV EF: 74%.  No previous exam available for comparison. Low risk.   EKG:   EKG 12/07/2020: Normal sinus rhythm at rate of 83 bpm, left atrial enlargement, left axis deviation, left intrafascicular block.  Anteroseptal infarct old.  IVCD, borderline criteria for LVH.  No significant change  from EKG 10/10/1998: Normal sinus rhythm at rate of 95 bpm, left axis deviation, left anterior fascicular block.  Poor R wave progression, cannot exclude anteroseptal infarct old.  Assessment     ICD-10-CM   1. SOB (shortness of breath)  R06.02     2. Primary hypertension  I10     3. Aortic atherosclerosis (HCC)  I70.0     4. Type 2 diabetes mellitus without complication, with long-term current use of insulin (HCC)  E11.9    Z79.4       There are no discontinued medications.   No orders of the defined types were placed in this encounter.  No orders of the defined types were placed in this encounter.  Recommendations:   Mary Choi is a 70 y.o. African-American female patient with hypertension, hyperlipidemia, controlled diabetes mellitus without complication, mild reactive airway disease who has been complaining of worsening shortness of breath that started about 6 to 7 months ago.  She has had pulmonary evaluation and no absolute etiology was found.  In view of multiple cardiovascular risk factors, and worsening symptoms of dyspnea, she is now referred for cardiac evaluation and restratification.  She has history of history of breast cancer in situ SP mastectomy, there was no chemo or radiation therapy.  I reviewed the results of the echocardiogram, essentially revealing mild diastolic dysfunction and mild MR and TR.  I suspect given her low risk nuclear stress test, her dyspnea is related to deconditioning and age-related slowing down.  I have reassured her, knowing that major medical issues with regard to pulmonary and cardiac sources are ruled out, that she needs to increase her physical activity and cardiac exercise on a daily  basis.  Otherwise her risk factors are well controlled including hypertension, hyperlipidemia and she is at ideal body weight as well.  Hence I have reassured her.  Unless her symptoms are ongoing or worse, I will see her back on a as needed basis.  She  will probably need a repeat echocardiogram if murmur is different or much louder or if she has new symptoms of dyspnea.    She has aortic atherosclerosis, her CV risk is >10% (20%).  Hence she may benefit from 81 mg aspirin.   Adrian Prows, MD, [facility] 01/19/2021, 9:21 PM Office: [phone]

## 2021-01-25 DIAGNOSIS — D649 Anemia, unspecified: Secondary | ICD-10-CM | POA: Diagnosis not present

## 2021-01-25 DIAGNOSIS — I10 Essential (primary) hypertension: Secondary | ICD-10-CM | POA: Diagnosis not present

## 2021-01-25 DIAGNOSIS — E1169 Type 2 diabetes mellitus with other specified complication: Secondary | ICD-10-CM | POA: Diagnosis not present

## 2021-01-30 DIAGNOSIS — F064 Anxiety disorder due to known physiological condition: Secondary | ICD-10-CM | POA: Diagnosis not present

## 2021-01-30 DIAGNOSIS — I972 Postmastectomy lymphedema syndrome: Secondary | ICD-10-CM | POA: Diagnosis not present

## 2021-01-30 DIAGNOSIS — E1169 Type 2 diabetes mellitus with other specified complication: Secondary | ICD-10-CM | POA: Diagnosis not present

## 2021-01-30 DIAGNOSIS — I10 Essential (primary) hypertension: Secondary | ICD-10-CM | POA: Diagnosis not present

## 2021-02-05 DIAGNOSIS — H0102B Squamous blepharitis left eye, upper and lower eyelids: Secondary | ICD-10-CM | POA: Diagnosis not present

## 2021-02-05 DIAGNOSIS — E119 Type 2 diabetes mellitus without complications: Secondary | ICD-10-CM | POA: Diagnosis not present

## 2021-02-05 DIAGNOSIS — H0102A Squamous blepharitis right eye, upper and lower eyelids: Secondary | ICD-10-CM | POA: Diagnosis not present

## 2021-02-05 DIAGNOSIS — H5203 Hypermetropia, bilateral: Secondary | ICD-10-CM | POA: Diagnosis not present

## 2021-02-05 DIAGNOSIS — Z794 Long term (current) use of insulin: Secondary | ICD-10-CM | POA: Diagnosis not present

## 2021-02-05 DIAGNOSIS — H25813 Combined forms of age-related cataract, bilateral: Secondary | ICD-10-CM | POA: Diagnosis not present

## 2021-02-05 DIAGNOSIS — H524 Presbyopia: Secondary | ICD-10-CM | POA: Diagnosis not present

## 2021-02-05 DIAGNOSIS — H04123 Dry eye syndrome of bilateral lacrimal glands: Secondary | ICD-10-CM | POA: Diagnosis not present

## 2021-02-05 DIAGNOSIS — H1045 Other chronic allergic conjunctivitis: Secondary | ICD-10-CM | POA: Diagnosis not present

## 2021-02-12 DIAGNOSIS — I890 Lymphedema, not elsewhere classified: Secondary | ICD-10-CM | POA: Diagnosis not present

## 2021-02-12 DIAGNOSIS — S82851S Displaced trimalleolar fracture of right lower leg, sequela: Secondary | ICD-10-CM | POA: Diagnosis not present

## 2021-02-12 DIAGNOSIS — Z853 Personal history of malignant neoplasm of breast: Secondary | ICD-10-CM | POA: Diagnosis not present

## 2021-02-13 DIAGNOSIS — I890 Lymphedema, not elsewhere classified: Secondary | ICD-10-CM | POA: Diagnosis not present

## 2021-02-17 DIAGNOSIS — Z23 Encounter for immunization: Secondary | ICD-10-CM | POA: Diagnosis not present

## 2021-02-19 DIAGNOSIS — S82851S Displaced trimalleolar fracture of right lower leg, sequela: Secondary | ICD-10-CM | POA: Diagnosis not present

## 2021-02-19 DIAGNOSIS — I890 Lymphedema, not elsewhere classified: Secondary | ICD-10-CM | POA: Diagnosis not present

## 2021-02-19 DIAGNOSIS — Z853 Personal history of malignant neoplasm of breast: Secondary | ICD-10-CM | POA: Diagnosis not present

## 2021-02-26 DIAGNOSIS — Z853 Personal history of malignant neoplasm of breast: Secondary | ICD-10-CM | POA: Diagnosis not present

## 2021-02-26 DIAGNOSIS — E119 Type 2 diabetes mellitus without complications: Secondary | ICD-10-CM | POA: Diagnosis not present

## 2021-02-26 DIAGNOSIS — I972 Postmastectomy lymphedema syndrome: Secondary | ICD-10-CM | POA: Diagnosis not present

## 2021-02-26 DIAGNOSIS — R293 Abnormal posture: Secondary | ICD-10-CM | POA: Diagnosis not present

## 2021-02-26 DIAGNOSIS — I10 Essential (primary) hypertension: Secondary | ICD-10-CM | POA: Diagnosis not present

## 2021-02-27 ENCOUNTER — Telehealth: Payer: Self-pay | Admitting: Cardiology

## 2021-02-27 NOTE — Telephone Encounter (Signed)
Follow Up:     Pt said somebody had called today , but she did not know who it was.

## 2021-02-27 NOTE — Telephone Encounter (Signed)
Called patient to get more information about this call she received. Left message for her to return the call.

## 2021-02-27 NOTE — Telephone Encounter (Addendum)
Spoke with patient. She is not a [facility] patient as showed on the message. She sees Dr. Einar Gip. She returned my call because I left a message. She does not know who called her. We decided to leave the situation be and wait until she gets another call.

## 2021-03-07 DIAGNOSIS — R293 Abnormal posture: Secondary | ICD-10-CM | POA: Diagnosis not present

## 2021-03-07 DIAGNOSIS — Z853 Personal history of malignant neoplasm of breast: Secondary | ICD-10-CM | POA: Diagnosis not present

## 2021-03-07 DIAGNOSIS — I10 Essential (primary) hypertension: Secondary | ICD-10-CM | POA: Diagnosis not present

## 2021-03-07 DIAGNOSIS — I972 Postmastectomy lymphedema syndrome: Secondary | ICD-10-CM | POA: Diagnosis not present

## 2021-03-07 DIAGNOSIS — E119 Type 2 diabetes mellitus without complications: Secondary | ICD-10-CM | POA: Diagnosis not present

## 2021-03-14 DIAGNOSIS — R293 Abnormal posture: Secondary | ICD-10-CM | POA: Diagnosis not present

## 2021-03-14 DIAGNOSIS — I10 Essential (primary) hypertension: Secondary | ICD-10-CM | POA: Diagnosis not present

## 2021-03-14 DIAGNOSIS — E119 Type 2 diabetes mellitus without complications: Secondary | ICD-10-CM | POA: Diagnosis not present

## 2021-03-14 DIAGNOSIS — I972 Postmastectomy lymphedema syndrome: Secondary | ICD-10-CM | POA: Diagnosis not present

## 2021-03-14 DIAGNOSIS — Z853 Personal history of malignant neoplasm of breast: Secondary | ICD-10-CM | POA: Diagnosis not present

## 2021-03-18 IMAGING — US US BREAST*R* LIMITED INC AXILLA
1 series · 5 of 5 positions shown · non-contrast
Comparison: None.

CLINICAL DATA: 69-year-old female presenting for evaluation of
palpable areas in the right breast. She has history of right breast
mastectomy and flap reconstruction. She noticed these palpable areas
several months ago, however there has been no change since they were
first identified.

EXAM:
ULTRASOUND OF THE RIGHT BREAST

[Series 1: us breast*right* limited inc axilla · 0.06mm/px · 5 of 5 slices shown]
[im 1/5]
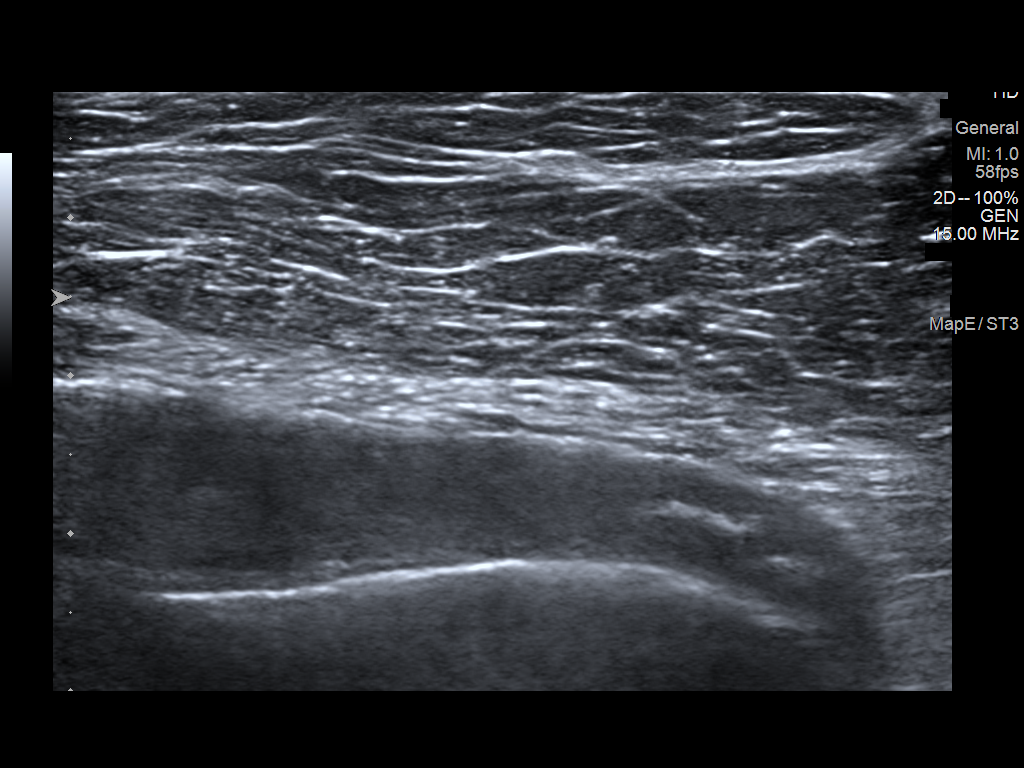
[im 2/5]
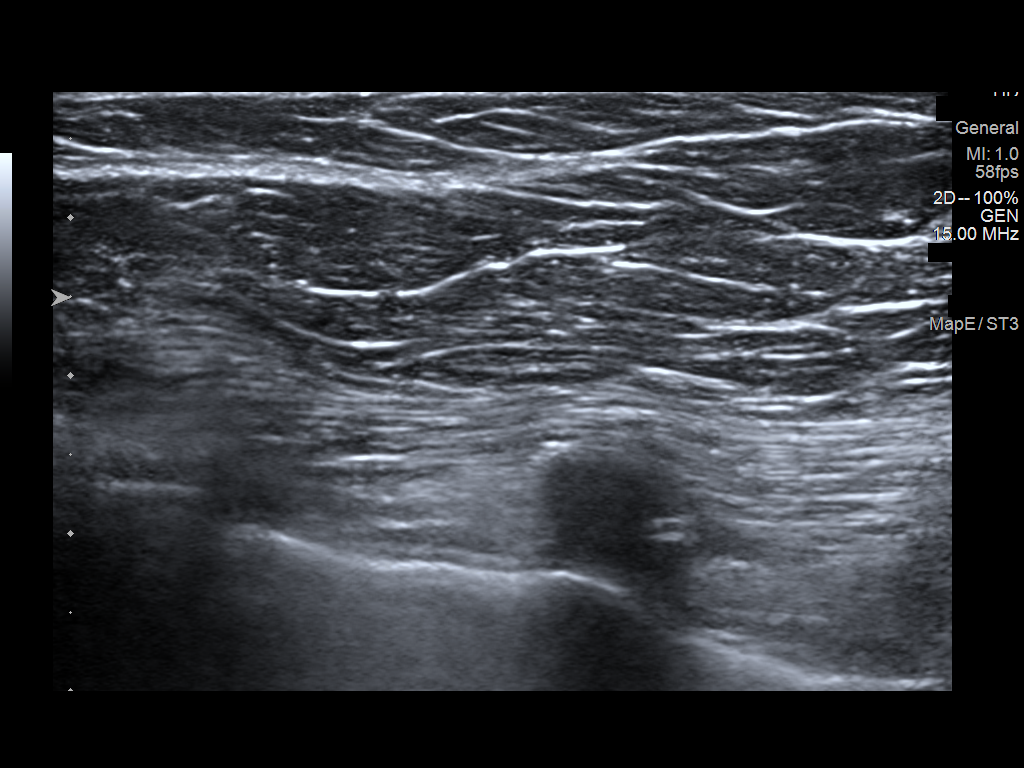
[im 3/5]
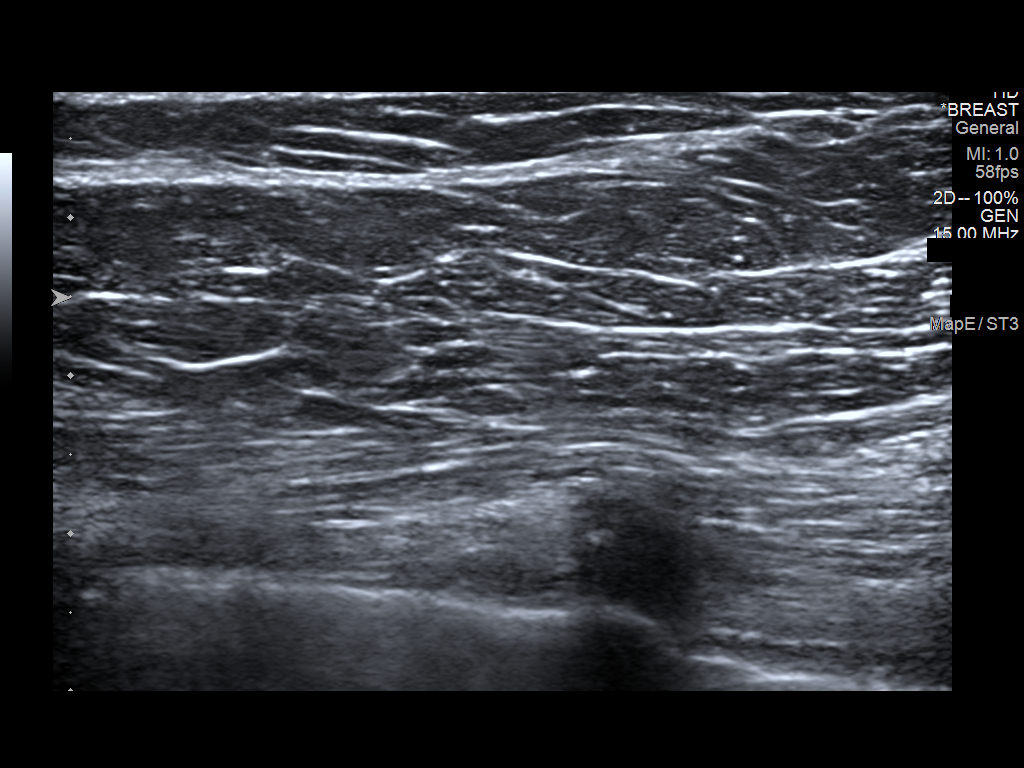
[im 4/5]
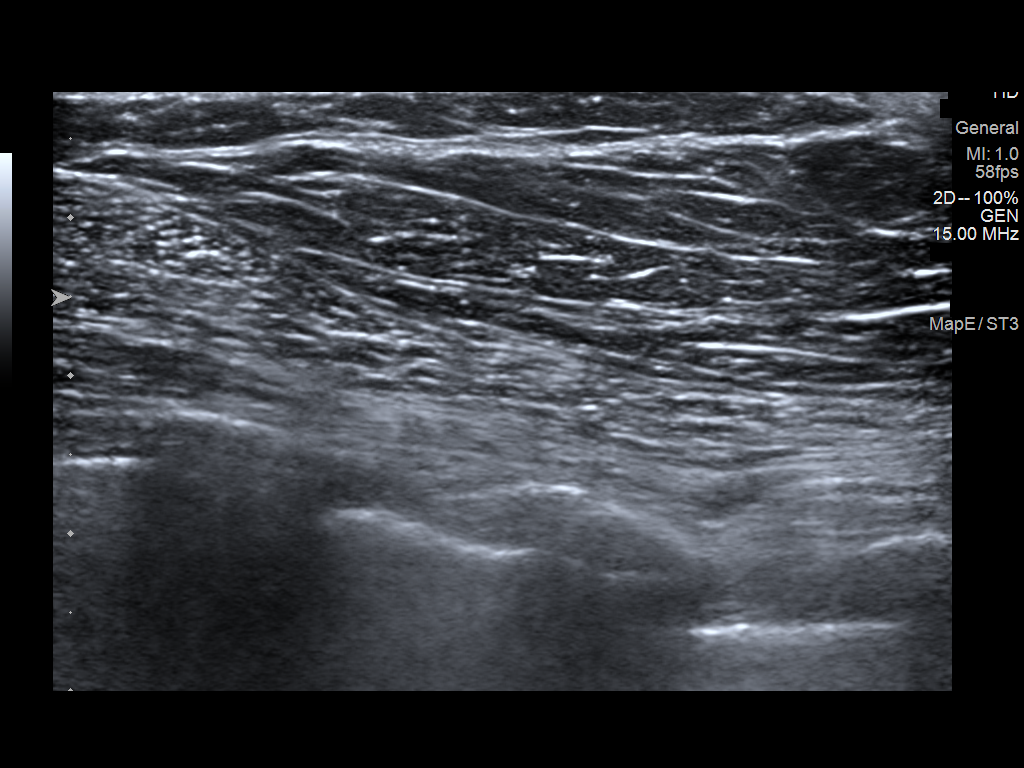
[im 5/5]
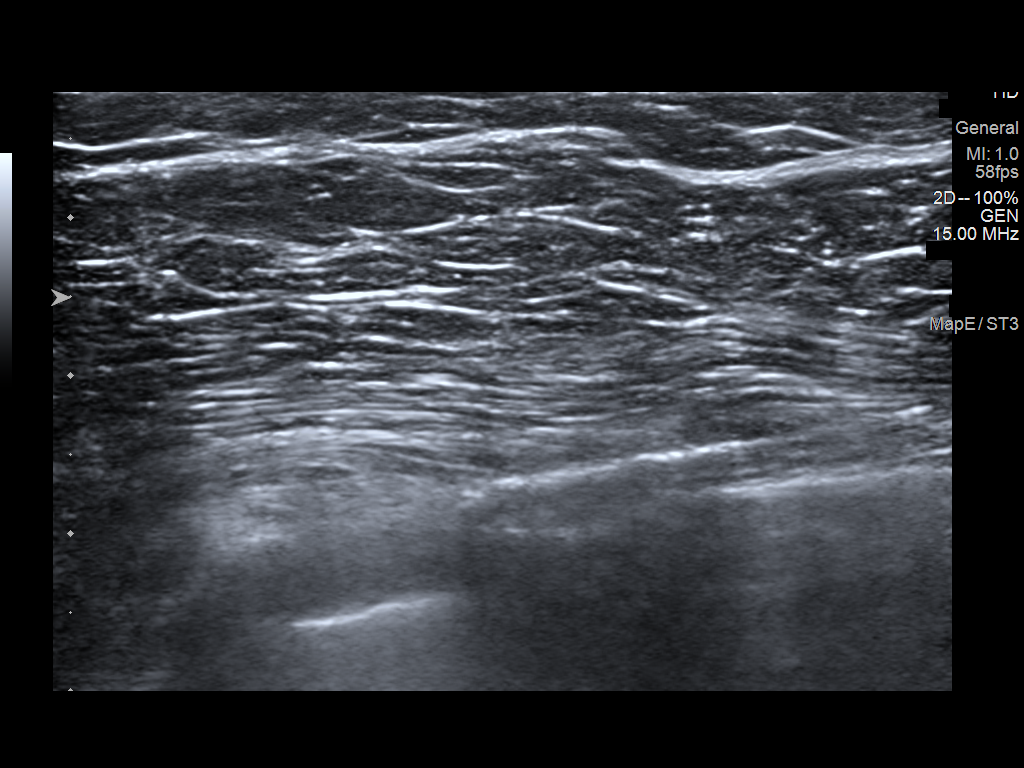

[5 of 5 positions shown; findings below may reference images not displayed]

FINDINGS: On physical exam, no discrete palpable masses are identified in the
superior upper inner and upper outer right breast.

Targeted ultrasound is performed, showing normal subcutaneous
tissues in the superior right breast. No suspicious masses or areas
of shadowing are identified. Representative images were acquired at
2 o'clock and at 12 o'clock.
IMPRESSION: No suspicious targeted sonographic abnormalities at the palpable
site of concern.

RECOMMENDATION:
1. Clinical follow-up recommended for the palpable area of concern
in the reconstructed right breast. Any further workup should be
based on clinical grounds.

2. The patient is due for screening of her left breast in [REDACTED]

I have discussed the findings and recommendations with the patient.
If applicable, a reminder letter will be sent to the patient
regarding the next appointment.

BI-RADS CATEGORY  1: Negative.

## 2021-03-24 ENCOUNTER — Ambulatory Visit
Admission: EM | Admit: 2021-03-24 | Discharge: 2021-03-24 | Disposition: A | Discharge status: Home / Self Care | Payer: Medicare PPO | Attending: Internal Medicine | Admitting: Internal Medicine

## 2021-03-24 ENCOUNTER — Other Ambulatory Visit: Payer: Self-pay

## 2021-03-24 ENCOUNTER — Encounter: Payer: Self-pay | Admitting: Emergency Medicine

## 2021-03-24 DIAGNOSIS — J069 Acute upper respiratory infection, unspecified: Secondary | ICD-10-CM | POA: Diagnosis not present

## 2021-03-24 DIAGNOSIS — R059 Cough, unspecified: Secondary | ICD-10-CM | POA: Insufficient documentation

## 2021-03-24 DIAGNOSIS — Z20822 Contact with and (suspected) exposure to covid-19: Secondary | ICD-10-CM | POA: Diagnosis not present

## 2021-03-24 DIAGNOSIS — J029 Acute pharyngitis, unspecified: Secondary | ICD-10-CM

## 2021-03-24 LAB — POCT RAPID STREP A (OFFICE): Rapid Strep A Screen: NEGATIVE

## 2021-03-24 MED ORDER — FLUTICASONE PROPIONATE 50 MCG/ACT NA SUSP: 1.0000 | Freq: Every day | NASAL | 0 refills | Status: DC

## 2021-03-24 MED ORDER — GUAIFENESIN 200 MG PO TABS: 200.0000 mg | ORAL_TABLET | ORAL | 0 refills | Status: DC | PRN

## 2021-03-24 MED ORDER — CETIRIZINE HCL 10 MG PO TABS: 10.0000 mg | ORAL_TABLET | Freq: Every day | ORAL | 0 refills | Status: DC

## 2021-03-24 NOTE — ED Triage Notes (Signed)
Patient c/o dry cough for several days, sore throat that feels like sandpaper.  Patient hasn't tried any OTC meds for cough.  Patient is vaccinated for COVID.

## 2021-03-24 NOTE — Discharge Instructions (Addendum)
Your rapid strep test was negative. Throat culture and covid 19 viral swab are pending. We will if these are positive. You have been prescribed medications to help with symptoms.

## 2021-03-24 NOTE — ED Provider Notes (Signed)
[facility]    CSN: [id number] Arrival date & time: 03/24/21  1418      History   Chief Complaint Chief Complaint  Patient presents with   Cough    HPI Mary Choi is a 70 y.o. female.   Patient presents with cough, sore throat, nasal congestion, runny nose for 4-5 days. Denies any known fevers or sick contacts. Denies any chest pain or shortness of breath. Has not yet taken any OTC medications because she "didn't know what to take".    Cough  Past Medical History:  Diagnosis Date   Allergy    Anemia    Asthma    Breast cancer ([place]) 1996   Cancer ([place])    Diabetes mellitus without complication ([place])    Hyperlipidemia    Hypertension     Patient Active Problem List   Diagnosis Date Noted   Dislocation 01/26/2015   Ankle fracture 01/25/2015   HTN (hypertension) 01/25/2015   HLD (hyperlipidemia) 01/25/2015   Asthma 01/25/2015   Diabetes ([place]) 01/07/2013    Past Surgical History:  Procedure Laterality Date   ABDOMINAL HYSTERECTOMY     BREAST SURGERY     CESAREAN SECTION     MASTECTOMY Right    ORIF ANKLE FRACTURE Right 01/31/2015   Procedure: OPEN REDUCTION INTERNAL FIXATION (ORIF) ANKLE FRACTURE;  Surgeon: Renette Butters, MD;  Location: [facility];  Service: Orthopedics;  Laterality: Right;    OB History   No obstetric history on file.      Home Medications    Prior to Admission medications   Medication Sig Start Date End Date Taking? Authorizing Provider  albuterol (PROVENTIL HFA;VENTOLIN HFA) 108 (90 BASE) MCG/ACT inhaler Inhale 2 puffs into the lungs every 6 (six) hours as needed for wheezing.   Yes [provider]  amLODipine (NORVASC) 10 MG tablet Take 10 mg by mouth daily.   Yes [provider]  aspirin (ASPIRIN CHILDRENS) 81 MG chewable tablet Chew 1 tablet (81 mg total) by mouth daily. 12/07/20  Yes Adrian Prows, MD  baclofen (LIORESAL) 10 MG tablet Take 10 mg by mouth 3 (three) times daily.   Yes  [provider]  cetirizine (ZYRTEC) 10 MG tablet Take 1 tablet (10 mg total) by mouth daily. 03/24/21  Yes Odis Luster, FNP  cholecalciferol (VITAMIN D3) 25 MCG (1000 UNIT) tablet Take 1,000 Units by mouth daily.   Yes [provider]  citalopram (CELEXA) 10 MG tablet Take 10 mg by mouth daily.   Yes [provider]  fluticasone (FLONASE) 50 MCG/ACT nasal spray Place 1 spray into both nostrils daily. 03/24/21  Yes Odis Luster, FNP  guaiFENesin 200 MG tablet Take 1 tablet (200 mg total) by mouth every 4 (four) hours as needed for cough or to loosen phlegm. 03/24/21  Yes Odis Luster, FNP  insulin glargine, 2 Unit Dial, (TOUJEO MAX SOLOSTAR) 300 UNIT/ML Solostar Pen Inject 10 Units into the skin at bedtime.   Yes [provider]  losartan-hydrochlorothiazide (HYZAAR) 100-25 MG per tablet Take 1 tablet by mouth daily.   Yes [provider]  metFORMIN (GLUCOPHAGE) 1000 MG tablet Take 1,000 mg by mouth at bedtime.    Yes [provider]  pioglitazone (ACTOS) 15 MG tablet Take 15 mg by mouth every other day.    Yes [provider]  Potassium Chloride Crys ER (KLOR-CON M20 PO) Take 1 tablet by mouth daily at 12 noon.   Yes [provider]  rosuvastatin (CRESTOR) 10 MG tablet Take 10 mg by mouth daily.   Yes [provider]  Semaglutide,0.25 or 0.'5MG'$ /DOS, 2 MG/1.5ML SOPN Ozempic 0.25 mg or 0.5 mg (2 mg/1.5 mL) subcutaneous pen injector   Yes [provider]    Family History Family History  Problem Relation Age of Onset   Diabetes Mother    Hypertension Mother    Heart disease Brother    Pancreatic cancer Maternal Grandfather    Colon cancer Neg Hx    Esophageal cancer Neg Hx    Liver cancer Neg Hx    Rectal cancer Neg Hx    Stomach cancer Neg Hx     Social History Social History   Tobacco Use   Smoking status: Never   Smokeless tobacco: Never  Vaping Use   Vaping Use: Never used  Substance  Use Topics   Alcohol use: Yes    Comment: social    Drug use: No     Allergies   Patient has no known allergies.   Review of Systems Review of Systems Per HPI  Physical Exam Triage Vital Signs ED Triage Vitals  Enc Vitals Group     BP 03/24/21 1501 123/78     Pulse Rate 03/24/21 1501 (!) 102     Resp 03/24/21 1501 18     Temp 03/24/21 1501 98.8 F (37.1 C)     Temp Source 03/24/21 1501 Oral     SpO2 03/24/21 1501 96 %     Weight 03/24/21 1503 152 lb (68.9 kg)     Height 03/24/21 1503 '5\' 4"'$  (1.626 m)     Head Circumference --      Peak Flow --      Pain Score 03/24/21 1503 8     Pain Loc --      Pain Edu? --      Excl. in [place]? --    No data found.  Updated Vital Signs BP 123/78 (BP Location: Left Arm)   Pulse (!) 102   Temp 98.8 F (37.1 C) (Oral)   Resp 18   Ht '5\' 4"'$  (1.626 m)   Wt 152 lb (68.9 kg)   SpO2 96%   BMI 26.09 kg/m   Visual Acuity Right Eye Distance:   Left Eye Distance:   Bilateral Distance:    Right Eye Near:   Left Eye Near:    Bilateral Near:     Physical Exam Constitutional:      General: She is not in acute distress.    Appearance: Normal appearance.  HENT:     Head: Normocephalic and atraumatic.     Right Ear: Tympanic membrane and ear canal normal.     Left Ear: Tympanic membrane and ear canal normal.     Nose: Congestion present.     Mouth/Throat:     Mouth: Mucous membranes are moist.     Pharynx: Posterior oropharyngeal erythema present.  Eyes:     Extraocular Movements: Extraocular movements intact.     Conjunctiva/sclera: Conjunctivae normal.     Pupils: Pupils are equal, round, and reactive to light.  Cardiovascular:     Rate and Rhythm: Normal rate and regular rhythm.     Pulses: Normal pulses.     Heart sounds: Normal heart sounds.  Pulmonary:     Effort: Pulmonary effort is normal. No respiratory distress.     Breath sounds: Normal breath sounds. No wheezing.  Abdominal:     General: Abdomen is  flat. Bowel  sounds are normal.     Palpations: Abdomen is soft.  Musculoskeletal:        General: Normal range of motion.     Cervical back: Normal range of motion.  Skin:    General: Skin is warm and dry.  Neurological:     General: No focal deficit present.     Mental Status: She is alert and oriented to person, place, and time. Mental status is at baseline.  Psychiatric:        Mood and Affect: Mood normal.        Behavior: Behavior normal.     UC Treatments / Results  Labs (all labs ordered are listed, but only abnormal results are displayed) Labs Reviewed  NOVEL CORONAVIRUS, NAA  POCT RAPID STREP A (OFFICE)    EKG   Radiology No results found.  Procedures Procedures (including critical care time)  Medications Ordered in UC Medications - No data to display  Initial Impression / Assessment and Plan / UC Course  I have reviewed the triage vital signs and the nursing notes.  Pertinent labs & imaging results that were available during my care of the patient were reviewed by me and considered in my medical decision making (see chart for details).     Patient presents with symptoms likely from a viral upper respiratory infection. Differential includes bacterial pneumonia, sinusitis, allergic rhinitis, Covid 19. Do not suspect underlying cardiopulmonary process. Symptoms seem unlikely related to ACS, CHF or COPD exacerbations, pneumonia, pneumothorax. Patient is nontoxic appearing and not in need of emergent medical intervention.  Recommended symptom control with over the counter medications: prescribed mucinex, cetirizine, and flonase to help with symptoms. Rapid strep was negative today. Throat culture and covid 91 viral swab are pending.   Return if symptoms fail to improve in 1-2 weeks or you develop shortness of breath, chest pain, severe headache. Patient states understanding and is agreeable.  Discharged with PCP followup.  Final Clinical Impressions(s) / UC Diagnoses    Final diagnoses:  Cough  Viral upper respiratory infection  Sore throat  Encounter for laboratory testing for COVID-19 virus     Discharge Instructions      Your rapid strep test was negative. Throat culture and covid 19 viral swab are pending. We will if these are positive. You have been prescribed medications to help with symptoms.      ED Prescriptions     Medication Sig Dispense Auth. Provider   guaiFENesin 200 MG tablet Take 1 tablet (200 mg total) by mouth every 4 (four) hours as needed for cough or to loosen phlegm. 30 suppository Odis Luster, FNP   cetirizine (ZYRTEC) 10 MG tablet Take 1 tablet (10 mg total) by mouth daily. 30 tablet Odis Luster, FNP   fluticasone (FLONASE) 50 MCG/ACT nasal spray Place 1 spray into both nostrils daily. 16 g Odis Luster, FNP      PDMP not reviewed this encounter.   Odis Luster, [place] 03/24/21 1544

## 2021-03-25 LAB — NOVEL CORONAVIRUS, NAA: SARS-CoV-2, NAA: DETECTED — AB

## 2021-03-25 LAB — SARS-COV-2, NAA 2 DAY TAT

## 2021-03-27 LAB — CULTURE, GROUP A STREP (THRC)

## 2021-04-09 DIAGNOSIS — I890 Lymphedema, not elsewhere classified: Secondary | ICD-10-CM | POA: Diagnosis not present

## 2021-04-16 DIAGNOSIS — L918 Other hypertrophic disorders of the skin: Secondary | ICD-10-CM | POA: Diagnosis not present

## 2021-04-16 DIAGNOSIS — D224 Melanocytic nevi of scalp and neck: Secondary | ICD-10-CM | POA: Diagnosis not present

## 2021-04-16 DIAGNOSIS — L723 Sebaceous cyst: Secondary | ICD-10-CM | POA: Diagnosis not present

## 2021-04-16 DIAGNOSIS — L720 Epidermal cyst: Secondary | ICD-10-CM | POA: Diagnosis not present

## 2021-04-16 DIAGNOSIS — L821 Other seborrheic keratosis: Secondary | ICD-10-CM | POA: Diagnosis not present

## 2021-04-18 DIAGNOSIS — I890 Lymphedema, not elsewhere classified: Secondary | ICD-10-CM | POA: Diagnosis not present

## 2021-04-18 DIAGNOSIS — R5383 Other fatigue: Secondary | ICD-10-CM | POA: Diagnosis not present

## 2021-04-30 DIAGNOSIS — I972 Postmastectomy lymphedema syndrome: Secondary | ICD-10-CM | POA: Diagnosis not present

## 2021-04-30 DIAGNOSIS — Z853 Personal history of malignant neoplasm of breast: Secondary | ICD-10-CM | POA: Diagnosis not present

## 2021-05-21 DIAGNOSIS — I890 Lymphedema, not elsewhere classified: Secondary | ICD-10-CM | POA: Diagnosis not present

## 2021-05-23 ENCOUNTER — Other Ambulatory Visit: Payer: Self-pay | Admitting: Obstetrics & Gynecology

## 2021-05-23 DIAGNOSIS — Z1231 Encounter for screening mammogram for malignant neoplasm of breast: Secondary | ICD-10-CM

## 2021-06-01 DIAGNOSIS — I10 Essential (primary) hypertension: Secondary | ICD-10-CM | POA: Diagnosis not present

## 2021-06-01 DIAGNOSIS — E1169 Type 2 diabetes mellitus with other specified complication: Secondary | ICD-10-CM | POA: Diagnosis not present

## 2021-06-01 DIAGNOSIS — F064 Anxiety disorder due to known physiological condition: Secondary | ICD-10-CM | POA: Diagnosis not present

## 2021-06-04 DIAGNOSIS — H0102A Squamous blepharitis right eye, upper and lower eyelids: Secondary | ICD-10-CM | POA: Diagnosis not present

## 2021-06-04 DIAGNOSIS — F064 Anxiety disorder due to known physiological condition: Secondary | ICD-10-CM | POA: Diagnosis not present

## 2021-06-04 DIAGNOSIS — H0102B Squamous blepharitis left eye, upper and lower eyelids: Secondary | ICD-10-CM | POA: Diagnosis not present

## 2021-06-04 DIAGNOSIS — I10 Essential (primary) hypertension: Secondary | ICD-10-CM | POA: Diagnosis not present

## 2021-06-04 DIAGNOSIS — E1169 Type 2 diabetes mellitus with other specified complication: Secondary | ICD-10-CM | POA: Diagnosis not present

## 2021-06-04 DIAGNOSIS — E782 Mixed hyperlipidemia: Secondary | ICD-10-CM | POA: Diagnosis not present

## 2021-06-04 DIAGNOSIS — H1045 Other chronic allergic conjunctivitis: Secondary | ICD-10-CM | POA: Diagnosis not present

## 2021-06-04 DIAGNOSIS — R0602 Shortness of breath: Secondary | ICD-10-CM | POA: Diagnosis not present

## 2021-06-13 DIAGNOSIS — C50911 Malignant neoplasm of unspecified site of right female breast: Secondary | ICD-10-CM | POA: Diagnosis not present

## 2021-07-02 ENCOUNTER — Ambulatory Visit: Payer: Medicare PPO | Admitting: Podiatry

## 2021-07-02 ENCOUNTER — Ambulatory Visit
Admission: RE | Admit: 2021-07-02 | Discharge: 2021-07-02 | Disposition: A | Discharge status: Home / Self Care | Payer: Medicare PPO | Source: Ambulatory Visit | Attending: Obstetrics & Gynecology | Admitting: Obstetrics & Gynecology

## 2021-07-02 ENCOUNTER — Other Ambulatory Visit: Payer: Self-pay

## 2021-07-02 ENCOUNTER — Encounter: Payer: Self-pay | Admitting: Podiatry

## 2021-07-02 DIAGNOSIS — Z1231 Encounter for screening mammogram for malignant neoplasm of breast: Secondary | ICD-10-CM | POA: Diagnosis not present

## 2021-07-02 DIAGNOSIS — E119 Type 2 diabetes mellitus without complications: Secondary | ICD-10-CM | POA: Diagnosis not present

## 2021-07-02 DIAGNOSIS — M79674 Pain in right toe(s): Secondary | ICD-10-CM | POA: Diagnosis not present

## 2021-07-02 DIAGNOSIS — B351 Tinea unguium: Secondary | ICD-10-CM | POA: Diagnosis not present

## 2021-07-02 DIAGNOSIS — M79675 Pain in left toe(s): Secondary | ICD-10-CM

## 2021-07-03 ENCOUNTER — Encounter: Payer: Self-pay | Admitting: Cardiology

## 2021-07-03 ENCOUNTER — Ambulatory Visit: Payer: Medicare PPO | Admitting: Cardiology

## 2021-07-03 VITALS — BP 117/74 | HR 100 | Temp 97.6°F | Resp 16 | Ht 64.0 in | Wt 156.4 lb

## 2021-07-03 DIAGNOSIS — R0602 Shortness of breath: Secondary | ICD-10-CM | POA: Diagnosis not present

## 2021-07-03 DIAGNOSIS — I5032 Chronic diastolic (congestive) heart failure: Secondary | ICD-10-CM | POA: Diagnosis not present

## 2021-07-03 DIAGNOSIS — I10 Essential (primary) hypertension: Secondary | ICD-10-CM

## 2021-07-03 MED ORDER — DILTIAZEM HCL ER COATED BEADS 240 MG PO CP24: 240.0000 mg | ORAL_CAPSULE | Freq: Every day | ORAL | 2 refills | Status: DC

## 2021-07-03 NOTE — Progress Notes (Signed)
Subjective:   Patient ID: Mary Choi, female   DOB: 70 y.o.   MRN: [id number]   HPI Patient presents for yearly diabetic examination also states her nails are thick and she cannot cut them herself and she would like to have this taken care of.  States her diabetes been her relative good control and she is currently utilizing oral medication for control   ROS      Objective:  Physical Exam  Neurovascular studies were done with good pulses noted and sharp dull vibratory intact with DTR reflexes intact.  Range of motion tested normal and muscle strength found to be adequate with patient also noted to have thick yellow brittle nailbeds 1-5 both feet that get incurvated in the corners and she has trouble cutting herself with chronic ingrown to the corners     Assessment:  Long-term diabetes with no apparent long-term sequela and doing a good job of control with mycotic nail infection with pain 1-5 both feet     Plan:  Reviewed the continuation of good treatment for her diabetes and also debrided nailbeds 1-5 both feet with no iatrogenic bleeding and advised on wider type of shoes cushion materials and daily inspections

## 2021-07-03 NOTE — Progress Notes (Signed)
Primary Physician/Referring:  Lucianne Lei, MD  Patient ID: Mary Choi, female    DOB: 20-Dec-1950, 70 y.o.   MRN: [id number]  Chief Complaint  Patient presents with  . Shortness of Breath    HPI:    Mary Choi  is a 70 y.o. African-American female patient with hypertension, hyperlipidemia, controlled diabetes mellitus without complication, mild reactive airway disease who has been complaining of worsening shortness of breath that started about 6 to 7 months ago.  She has had pulmonary evaluation and no absolute etiology was found.  She has history of history of breast cancer in situ SP mastectomy, there was no chemo or radiation therapy.  In view of hypertension, hyperlipidemia, diabetes mellitus, she underwent nuclear stress test and also echocardiogram in May and June 2022 which were low risk and showed mild diastolic dysfunction.  She made an appointment as her daughter noticed that she gets extremely short of breath even doing minimal activities.  No PND or orthopnea.  No leg edema.  Denies any chest discomfort or palpitations or dizziness or syncope.  Past Medical History:  Diagnosis Date  . Allergy   . Anemia   . Asthma   . Breast cancer ([place]) 1996  . Cancer ([place])   . Diabetes mellitus without complication ([place])   . Hyperlipidemia   . Hypertension    Past Surgical History:  Procedure Laterality Date  . ABDOMINAL HYSTERECTOMY    . BREAST SURGERY    . CESAREAN SECTION    . MASTECTOMY Right   . ORIF ANKLE FRACTURE Right 01/31/2015   Procedure: OPEN REDUCTION INTERNAL FIXATION (ORIF) ANKLE FRACTURE;  Surgeon: Renette Butters, MD;  Location: [facility];  Service: Orthopedics;  Laterality: Right;   Family History  Problem Relation Age of Onset  . Diabetes Mother   . Hypertension Mother   . Heart disease Brother   . Pancreatic cancer Maternal Grandfather   . Colon cancer Neg Hx   . Esophageal cancer Neg Hx   . Liver cancer Neg Hx   . Rectal cancer  Neg Hx   . Stomach cancer Neg Hx     Social History   Tobacco Use  . Smoking status: Never  . Smokeless tobacco: Never  Substance Use Topics  . Alcohol use: Yes    Comment: social    Marital Status: Widowed  ROS  Review of Systems  Cardiovascular:  Positive for dyspnea on exertion. Negative for chest pain and leg swelling.  Gastrointestinal:  Negative for melena.  Objective  Blood pressure 117/74, pulse 100, temperature 97.6 F (36.4 C), temperature source Temporal, resp. rate 16, height '5\' 4"'  (1.626 m), weight 156 lb 6.4 oz (70.9 kg), SpO2 100 %. Body mass index is 26.85 kg/m.  Vitals with BMI 07/03/2021 03/24/2021 01/19/2021  Height '5\' 4"'  '5\' 4"'  '5\' 4"'   Weight 156 lbs 6 oz 152 lbs 151 lbs 6 oz  BMI 26.83 34.91 79.15  Systolic 056 979 480  Diastolic 74 78 73  Pulse 165 102 103     Physical Exam  Eyes: Conjunctivae are normal.  Neck: No JVD present.  Cardiovascular: Regular rhythm, intact distal pulses and normal pulses. Exam reveals no gallop.  Murmur heard. Midsystolic murmur is present with a grade of 2/6 at the lower left sternal border and apex. Pulmonary/Chest: Effort normal and breath sounds normal. She exhibits no tenderness.  Abdominal: Soft. Bowel sounds are normal.  Skin: Skin is warm.    Laboratory examination:  Recent Labs    07/17/20 1121  NA 137  K 3.7  CL 100  CO2 28  GLUCOSE 182*  BUN 19  CREATININE 0.86  CALCIUM 9.6   CrCl cannot be calculated (Patient's most recent lab result is older than the maximum 21 days allowed.).  CMP Latest Ref Rng & Units 07/17/2020 01/25/2015 08/26/2009  Glucose 70 - 99 mg/dL 182(H) 193(H) 124(H)  BUN 6 - 23 mg/dL '19 15 20  ' Creatinine 0.40 - 1.20 mg/dL 0.86 0.89 0.7  Sodium 135 - 145 mEq/L 137 138 141  Potassium 3.5 - 5.1 mEq/L 3.7 4.4 3.7  Chloride 96 - 112 mEq/L 100 101 108  CO2 19 - 32 mEq/L 28 28 -  Calcium 8.4 - 10.5 mg/dL 9.6 9.3 -  Total Protein 6.0 - 8.3 g/dL - - -  Total Bilirubin 0.3 - 1.2 mg/dL - - -   Alkaline Phos 39 - 117 U/L - - -  AST 0 - 37 U/L - - -  ALT 0 - 35 U/L - - -   External labs:   Labs 11/08/2020:  Total cholesterol 166, triglycerides 43, HDL 82, LDL 72.  Non-HDL cholesterol 84.  Serum glucose 126 mg, BUN 19, creatinine 0.79, EGFR 87 mL.  Sodium 141, potassium 4.1, AST 50, ALT 32, mildly elevated.  Otherwise CMP normal.  Hb 9.6/HCT 32.7, platelets 129.  Normal indicis.  A1c 7.1%.  Medications and allergies  No Known Allergies   Current Outpatient Medications on File Prior to Visit  Medication Sig Dispense Refill  . albuterol (PROVENTIL HFA;VENTOLIN HFA) 108 (90 BASE) MCG/ACT inhaler Inhale 2 puffs into the lungs every 6 (six) hours as needed for wheezing.    . baclofen (LIORESAL) 10 MG tablet Take 10 mg by mouth 3 (three) times daily.    . cholecalciferol (VITAMIN D3) 25 MCG (1000 UNIT) tablet Take 1,000 Units by mouth daily.    . insulin glargine, 2 Unit Dial, (TOUJEO MAX SOLOSTAR) 300 UNIT/ML Solostar Pen Inject 10 Units into the skin at bedtime.    Marland Kitchen losartan-hydrochlorothiazide (HYZAAR) 100-25 MG per tablet Take 1 tablet by mouth daily.    . metFORMIN (GLUCOPHAGE) 1000 MG tablet Take 1,000 mg by mouth at bedtime.     . pioglitazone (ACTOS) 15 MG tablet Take 15 mg by mouth every other day.     . Potassium Chloride Crys ER (KLOR-CON M20 PO) Take 1 tablet by mouth daily at 12 noon.    . rosuvastatin (CRESTOR) 10 MG tablet Take 10 mg by mouth daily.    . Semaglutide,0.25 or 0.5MG/DOS, 2 MG/1.5ML SOPN Ozempic 0.25 mg or 0.5 mg (2 mg/1.5 mL) subcutaneous pen injector     No current facility-administered medications on file prior to visit.     Radiology:   Colonoscopy 11/14/2020: - Mild pancolonic diverticulosis.                           - Non-bleeding internal hemorrhoids.                           - The examination was otherwise normal on direct                            and retroflexion views.  CT scan of the chest 11/18/2019: No significant  extracardiac abnormality. Normal heart size, mitral annular calcification present.  Mild atherosclerotic  calcification of the proximal descending thoracic aorta.  Tortuous right subclavian artery.  This explains for focal nodular opacity seen on chest x-ray.   Cardiac Studies:  [facility]V ECHOCARDIOGRAM COMPLETE 12/19/2020 Left ventricle cavity is normal in size. Mild concentric hypertrophy of the left ventricle. Normal global wall motion. Normal LV systolic function with EF 59%. Doppler evidence of grade I (impaired) diastolic dysfunction, normal LAP. Mild (Grade I) mitral regurgitation. Mild to moderate tricuspid regurgitation. Estimated pulmonary artery systolic pressure 26 mmHg.  Exercise tetrofosmin stress test  12/27/2020: Normal ECG stress. The patient exercised for 4 minutes and 28 seconds of a Bruce protocol, achieving approximately 6.41 METs.  The calculated [facility] Treadmill Score is 4.47. Normal BP response. Exercise capacity reduced. Myocardial perfusion is normal. Overall LV systolic function is normal without regional wall motion abnormalities. Stress LV EF: 74%.  No previous exam available for comparison. Low risk.   EKG:   EKG 07/03/2021: Normal sinus rhythm at rate of 92 bpm, left atrial enlargement, left axis deviation, left anterior fascicular block.  Poor R wave progression, cannot exclude anteroseptal infarct old.  No significant change from prior EKG 12/07/2020.  Assessment     ICD-10-CM   1. SOB (shortness of breath)  R06.02 EKG 12-Lead    Brain natriuretic peptide    Pulmonary rehab therapeutic exercise    2. Primary hypertension  I10 diltiazem (CARDIZEM CD) 240 MG 24 hr capsule    3. Chronic diastolic (congestive) heart failure (HCC)  I50.32 Pulmonary rehab therapeutic exercise      Medications Discontinued During This Encounter  Medication Reason  . cetirizine (ZYRTEC) 10 MG tablet   . citalopram (CELEXA) 10 MG tablet   . fluticasone (FLONASE) 50 MCG/ACT nasal spray    . guaiFENesin 200 MG tablet   . aspirin (ASPIRIN CHILDRENS) 81 MG chewable tablet   . amLODipine (NORVASC) 10 MG tablet Change in therapy     Meds ordered this encounter  Medications  . diltiazem (CARDIZEM CD) 240 MG 24 hr capsule    Sig: Take 1 capsule (240 mg total) by mouth daily.    Dispense:  30 capsule    Refill:  2    Discontinue amlodipine    Orders Placed This Encounter  Procedures  . Brain natriuretic peptide  . Pulmonary rehab therapeutic exercise    Standing Status:   Future    Standing Expiration Date:   07/03/2022  . EKG 12-Lead    Recommendations:   SAMAIYAH HOWES is a 70 y.o. African-American female patient with hypertension, hyperlipidemia, controlled diabetes mellitus without complication, mild reactive airway disease who has been complaining of worsening shortness of breath that started about 6 to 7 months ago.  She has had pulmonary evaluation and no absolute etiology was found.  She has history of history of breast cancer in situ SP mastectomy, there was no chemo or radiation therapy.  In view of hypertension, hyperlipidemia, diabetes mellitus, she underwent nuclear stress test and also echocardiogram in May and June 2022 which were low risk and showed mild diastolic dysfunction.  She made an appointment to see me today as her daughter has noticed that she gets extremely short of breath even with minimal activity.  Her physical examination does not reveal any acute decompensated heart failure.  I suspect her dyspnea on exertion is related to deconditioning as during stress that she only was able to walk for 4 minutes and achieved her target heart rate.  I will discontinue amlodipine in view of elevated  heart rate and try diltiazem CD to 40 mg daily.  I discussed with her regarding diastolic dysfunction chronic diastolic heart failure.  I will also obtain a BNP today.  I will make a referral for pulmonary rehabilitation which may reinforce exercise and may also  reassure her.  I would like to see him back in 6 weeks for follow-up.  Otherwise her risk factors including hypertension, hyperlipidemia are well controlled.    Adrian Prows, MD, [facility] 07/03/2021, 4:07 PM Office: [phone]

## 2021-07-03 NOTE — Patient Instructions (Signed)
Diastolic function means the heart has to relax to receive the blood so it can pump the blood out. If relaxation is impaired, then the blood coming to the heart is restricted and can cause dyspnea and reduced functional capacity and fluid build up. Exercise, weight loss, control of BP, salt restriction will improve this.

## 2021-07-04 DIAGNOSIS — C50911 Malignant neoplasm of unspecified site of right female breast: Secondary | ICD-10-CM | POA: Diagnosis not present

## 2021-07-04 LAB — BRAIN NATRIURETIC PEPTIDE: BNP: 21.3 pg/mL (ref 0.0–100.0)

## 2021-07-06 DIAGNOSIS — E1169 Type 2 diabetes mellitus with other specified complication: Secondary | ICD-10-CM | POA: Diagnosis not present

## 2021-07-06 DIAGNOSIS — E782 Mixed hyperlipidemia: Secondary | ICD-10-CM | POA: Diagnosis not present

## 2021-07-06 DIAGNOSIS — I10 Essential (primary) hypertension: Secondary | ICD-10-CM | POA: Diagnosis not present

## 2021-08-08 DIAGNOSIS — F4321 Adjustment disorder with depressed mood: Secondary | ICD-10-CM | POA: Diagnosis not present

## 2021-08-15 ENCOUNTER — Other Ambulatory Visit: Payer: Self-pay

## 2021-08-15 ENCOUNTER — Ambulatory Visit: Payer: Medicare PPO | Admitting: Cardiology

## 2021-08-15 ENCOUNTER — Encounter: Payer: Self-pay | Admitting: Cardiology

## 2021-08-15 VITALS — BP 136/80 | HR 90 | Temp 98.0°F | Resp 16 | Ht 64.0 in | Wt 155.2 lb

## 2021-08-15 DIAGNOSIS — R0602 Shortness of breath: Secondary | ICD-10-CM

## 2021-08-15 DIAGNOSIS — I5032 Chronic diastolic (congestive) heart failure: Secondary | ICD-10-CM

## 2021-08-15 DIAGNOSIS — I10 Essential (primary) hypertension: Secondary | ICD-10-CM | POA: Diagnosis not present

## 2021-08-15 MED ORDER — DILTIAZEM HCL ER COATED BEADS 240 MG PO CP24: 240.0000 mg | ORAL_CAPSULE | Freq: Every day | ORAL | 3 refills | Status: DC

## 2021-08-15 NOTE — Progress Notes (Signed)
Primary Physician/Referring:  Mary Lei, MD  Patient ID: Mary Choi, female    DOB: 1951-02-27, 71 y.o.   MRN: [id number]  Chief Complaint  Patient presents with   Shortness of Breath   Follow-up    HPI:    Mary Choi  is a 71 y.o. African-American female patient with hypertension, hyperlipidemia, controlled diabetes mellitus without complication, mild reactive airway disease who has been complaining of worsening shortness of breath that started about 6 to 7 months ago.  She has had pulmonary evaluation and no absolute etiology was found.  She has history of history of breast cancer in situ SP mastectomy, there was no chemo or radiation therapy.  In view of hypertension, hyperlipidemia, diabetes mellitus, she underwent nuclear stress test and also echocardiogram in May and June 2022 which were low risk and showed mild diastolic dysfunction.  Patient was seen 07/03/2021 for urgent visit at her request given the daughter had noticed patient experiencing dyspnea on exertion.  Last office visit suspected patient's dyspnea and we related to deconditioning.  Discontinued amlodipine and switch her to diltiazem CD 240 mg daily.  Also last office visit ordered BNP which was normal at 21.  Last office visit patient had been referred to pulmonary rehab but unfortunately did not hear from them about scheduling. 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   Denies chest pain, palpitations, syncope, near syncope denies orthopnea, PND, leg edema. Dyspnea on exertion is stable compared to last office visit.   Past Medical History:  Diagnosis Date   Allergy    Anemia    Asthma    Breast cancer ([place]) 1996   Cancer ([place])    Diabetes mellitus without complication ([place])    Hyperlipidemia    Hypertension    Past Surgical History:   Procedure Laterality Date   ABDOMINAL HYSTERECTOMY     BREAST SURGERY     CESAREAN SECTION     MASTECTOMY Right    ORIF ANKLE FRACTURE Right 01/31/2015   Procedure: OPEN REDUCTION INTERNAL FIXATION (ORIF) ANKLE FRACTURE;  Surgeon: Renette Butters, MD;  Location: [facility];  Service: Orthopedics;  Laterality: Right;   Family History  Problem Relation Age of Onset   Diabetes Mother    Hypertension Mother    Heart disease Brother    Pancreatic cancer Maternal Grandfather    Colon cancer Neg Hx    Esophageal cancer Neg Hx    Liver cancer Neg Hx    Rectal cancer Neg Hx    Stomach cancer Neg Hx     Social History   Tobacco Use   Smoking status: Never   Smokeless tobacco: Never  Substance Use Topics   Alcohol use: Yes    Comment: social    Marital Status: Widowed  ROS  Review of Systems  Cardiovascular:  Positive for dyspnea on exertion (stable). Negative for chest pain and leg swelling.  Gastrointestinal:  Negative for melena.  Objective  Blood pressure 136/80, pulse 90, temperature 98 F (36.7 C), temperature source Temporal, resp. rate 16, height _0  (1.626 m), weight 155 lb 3.2 oz (70.4 kg), SpO2 99 %. Body mass index is 26.64 kg/m.  Vitals with BMI 08/15/2021 08/15/2021 07/03/2021  Height - _1  _2   Weight - 155 lbs 3 oz 156  lbs 6 oz  BMI - 02.54 27.06  Systolic 237 628 315  Diastolic 80 80 74  Pulse 90 87 100     Physical Exam  Eyes: Conjunctivae are normal.  Neck: No JVD present.  Cardiovascular: Regular rhythm, intact distal pulses and normal pulses. Exam reveals no gallop.  Murmur heard. Midsystolic murmur is present with a grade of 2/6 at the lower left sternal border and apex. Pulmonary/Chest: Effort normal and breath sounds normal. She exhibits no tenderness.  Abdominal: Soft. Bowel sounds are normal.  Skin: Skin is warm.   Physical exam unchanged compared to previous office visit.  Laboratory examination:   No results for input(s):  NA, K, CL, CO2, GLUCOSE, BUN, CREATININE, CALCIUM, GFRNONAA, GFRAA in the last 8760 hours.  CrCl cannot be calculated (Patient's most recent lab result is older than the maximum 21 days allowed.).  CMP Latest Ref Rng & Units 07/17/2020 01/25/2015 08/26/2009  Glucose 70 - 99 mg/dL 182(H) 193(H) 124(H)  BUN 6 - 23 mg/dL _0 Creatinine 0.40 - 1.20 mg/dL 0.86 0.89 0.7  Sodium 135 - 145 mEq/L 137 138 141  Potassium 3.5 - 5.1 mEq/L 3.7 4.4 3.7  Chloride 96 - 112 mEq/L 100 101 108  CO2 19 - 32 mEq/L 28 28 -  Calcium 8.4 - 10.5 mg/dL 9.6 9.3 -  Total Protein 6.0 - 8.3 g/dL - - -  Total Bilirubin 0.3 - 1.2 mg/dL - - -  Alkaline Phos 39 - 117 U/L - - -  AST 0 - 37 U/L - - -  ALT 0 - 35 U/L - - -   External labs:   Labs 11/08/2020:  Total cholesterol 166, triglycerides 43, HDL 82, LDL 72.  Non-HDL cholesterol 84.  Serum glucose 126 mg, BUN 19, creatinine 0.79, EGFR 87 mL.  Sodium 141, potassium 4.1, AST 50, ALT 32, mildly elevated.  Otherwise CMP normal.  Hb 9.6/HCT 32.7, platelets 129.  Normal indicis.  A1c 7.1%.  Allergies  No Known Allergies   Medications Prior to Visit:   Outpatient Medications Prior to Visit  Medication Sig Dispense Refill   albuterol (PROVENTIL HFA;VENTOLIN HFA) 108 (90 BASE) MCG/ACT inhaler Inhale 2 puffs into the lungs every 6 (six) hours as needed for wheezing.     baclofen (LIORESAL) 10 MG tablet Take 10 mg by mouth 3 (three) times daily.     cholecalciferol (VITAMIN D3) 25 MCG (1000 UNIT) tablet Take 1,000 Units by mouth daily.     insulin glargine, 2 Unit Dial, (TOUJEO MAX SOLOSTAR) 300 UNIT/ML Solostar Pen Inject 10 Units into the skin at bedtime.     losartan-hydrochlorothiazide (HYZAAR) 100-25 MG per tablet Take 1 tablet by mouth daily.     metFORMIN (GLUCOPHAGE) 1000 MG tablet Take 1,000 mg by mouth at bedtime.      pioglitazone (ACTOS) 15 MG tablet Take 15 mg by mouth every other day.      Potassium Chloride Crys ER (KLOR-CON M20 PO) Take 1  tablet by mouth daily at 12 noon.     rosuvastatin (CRESTOR) 10 MG tablet Take 10 mg by mouth daily.     Semaglutide,0.25 or 0.5MG/DOS, 2 MG/1.5ML SOPN Ozempic 0.25 mg or 0.5 mg (2 mg/1.5 mL) subcutaneous pen injector     diltiazem (CARDIZEM CD) 240 MG 24 hr capsule Take 1 capsule (240 mg total) by mouth daily. 30 capsule 2   No facility-administered medications prior to visit.   Final Medications at End of Visit  Current Meds  Medication Sig   albuterol (PROVENTIL HFA;VENTOLIN HFA) 108 (90 BASE) MCG/ACT inhaler Inhale 2 puffs into the lungs every 6 (six) hours as needed for wheezing.   baclofen (LIORESAL) 10 MG tablet Take 10 mg by mouth 3 (three) times daily.   cholecalciferol (VITAMIN D3) 25 MCG (1000 UNIT) tablet Take 1,000 Units by mouth daily.   insulin glargine, 2 Unit Dial, (TOUJEO MAX SOLOSTAR) 300 UNIT/ML Solostar Pen Inject 10 Units into the skin at bedtime.   losartan-hydrochlorothiazide (HYZAAR) 100-25 MG per tablet Take 1 tablet by mouth daily.   metFORMIN (GLUCOPHAGE) 1000 MG tablet Take 1,000 mg by mouth at bedtime.    pioglitazone (ACTOS) 15 MG tablet Take 15 mg by mouth every other day.    Potassium Chloride Crys ER (KLOR-CON M20 PO) Take 1 tablet by mouth daily at 12 noon.   rosuvastatin (CRESTOR) 10 MG tablet Take 10 mg by mouth daily.   Semaglutide,0.25 or 0.5MG/DOS, 2 MG/1.5ML SOPN Ozempic 0.25 mg or 0.5 mg (2 mg/1.5 mL) subcutaneous pen injector   [DISCONTINUED] diltiazem (CARDIZEM CD) 240 MG 24 hr capsule Take 1 capsule (240 mg total) by mouth daily.   Radiology:   Colonoscopy 11/14/2020: - Mild pancolonic diverticulosis.                           - Non-bleeding internal hemorrhoids.                           - The examination was otherwise normal on direct                            and retroflexion views.  CT scan of the chest 11/18/2019: No significant extracardiac abnormality. Normal heart size, mitral annular calcification present.  Mild atherosclerotic  calcification of the proximal descending thoracic aorta.  Tortuous right subclavian artery.  This explains for focal nodular opacity seen on chest x-ray.   Cardiac Studies:  PCV ECHOCARDIOGRAM COMPLETE 12/19/2020 Left ventricle cavity is normal in size. Mild concentric hypertrophy of the left ventricle. Normal global wall motion. Normal LV systolic function with EF 59%. Doppler evidence of grade I (impaired) diastolic dysfunction, normal LAP. Mild (Grade I) mitral regurgitation. Mild to moderate tricuspid regurgitation. Estimated pulmonary artery systolic pressure 26 mmHg.  Exercise tetrofosmin stress test  12/27/2020: Normal ECG stress. The patient exercised for 4 minutes and 28 seconds of a Bruce protocol, achieving approximately 6.41 METs.  The calculated Duke Treadmill Score is 4.47. Normal BP response. Exercise capacity reduced. Myocardial perfusion is normal. Overall LV systolic function is normal without regional wall motion abnormalities. Stress LV EF: 74%.  No previous exam available for comparison. Low risk.   EKG:   EKG 07/03/2021: Normal sinus rhythm at rate of 92 bpm, left atrial enlargement, left axis deviation, left anterior fascicular block.  Poor R wave progression, cannot exclude anteroseptal infarct old.  No significant change from prior EKG 12/07/2020.  Assessment     ICD-10-CM   1. SOB (shortness of breath)  R06.02     2. Chronic diastolic (congestive) heart failure (HCC)  I50.32     3. Primary hypertension  I10 diltiazem (CARDIZEM CD) 240 MG 24 hr capsule      Medications Discontinued During This Encounter  Medication Reason   diltiazem (CARDIZEM CD) 240 MG 24 hr capsule Reorder     Meds ordered this  encounter  Medications   diltiazem (CARDIZEM CD) 240 MG 24 hr capsule    Sig: Take 1 capsule (240 mg total) by mouth daily.    Dispense:  90 capsule    Refill:  3    Discontinue amlodipine    No orders of the defined types were placed in this  encounter.   Recommendations:   Mary Choi is a 71 y.o. African-American female patient with hypertension, hyperlipidemia, controlled diabetes mellitus without complication, mild reactive airway disease who has been complaining of worsening shortness of breath that started about 6 to 7 months ago.  She has had pulmonary evaluation and no absolute etiology was found.  She has history of history of breast cancer in situ SP mastectomy, there was no chemo or radiation therapy.  In view of hypertension, hyperlipidemia, diabetes mellitus, she underwent nuclear stress test and also echocardiogram in May and June 2022 which were low risk and showed mild diastolic dysfunction.  Patient was seen 07/03/2021 for urgent visit at her request given the daughter had noticed patient experiencing dyspnea on exertion.  Last office visit suspected patient's dyspnea and we related to deconditioning.  Discontinued amlodipine and switch her to diltiazem CD 240 mg daily.  Also last office visit ordered BNP which was normal at 21.  Last office visit patient had been referred to pulmonary rehab but unfortunately this did not get scheduled as patient was not contacted.  Patient has been worked up by pulmonology and PCP with no identifiable cause of dyspnea.  She has also had low risk stress test and echocardiogram, details above.  Patient has not increased her physical activity since last office visit.  She continues to have dyspnea on exertion which is stable.  Discussed at length with patient management options including repeat echocardiogram versus a trial of increasing her physical activity at home or with pulmonary rehab.  Patient opted to focus on increasing physical activity at home on her own, she will continue to monitor dyspnea.  She is tolerating diltiazem without issue and blood pressure remains well controlled.  Follow-up in 3 months, sooner if needed, for diastolic dysfunction, hypertension, hyperlipidemia, and  dyspnea on exertion.   Alethia Berthold, PA-C 08/15/2021, 4:05 PM Office: [phone]

## 2021-08-22 DIAGNOSIS — F4321 Adjustment disorder with depressed mood: Secondary | ICD-10-CM | POA: Diagnosis not present

## 2021-08-28 DIAGNOSIS — B370 Candidal stomatitis: Secondary | ICD-10-CM | POA: Diagnosis not present

## 2021-08-28 DIAGNOSIS — R070 Pain in throat: Secondary | ICD-10-CM | POA: Diagnosis not present

## 2021-08-28 DIAGNOSIS — E1169 Type 2 diabetes mellitus with other specified complication: Secondary | ICD-10-CM | POA: Diagnosis not present

## 2021-08-29 ENCOUNTER — Other Ambulatory Visit (HOSPITAL_BASED_OUTPATIENT_CLINIC_OR_DEPARTMENT_OTHER): Payer: Self-pay

## 2021-08-29 MED ORDER — OZEMPIC (0.25 OR 0.5 MG/DOSE) 2 MG/1.5ML ~~LOC~~ SOPN
PEN_INJECTOR | SUBCUTANEOUS | 6 refills | Status: DC
  Filled 2021-08-29: qty 1.5, 28d supply, fill #0
  Filled 2021-09-26 – 2021-10-09 (×2): qty 1.5, 28d supply, fill #1
  Filled 2021-10-31 – 2021-11-27 (×2): qty 1.5, 28d supply, fill #2
  Filled 2022-02-03: qty 1.5, 28d supply, fill #3

## 2021-09-03 ENCOUNTER — Other Ambulatory Visit (HOSPITAL_BASED_OUTPATIENT_CLINIC_OR_DEPARTMENT_OTHER): Payer: Self-pay

## 2021-09-05 DIAGNOSIS — F4321 Adjustment disorder with depressed mood: Secondary | ICD-10-CM | POA: Diagnosis not present

## 2021-09-06 ENCOUNTER — Other Ambulatory Visit (HOSPITAL_BASED_OUTPATIENT_CLINIC_OR_DEPARTMENT_OTHER): Payer: Self-pay

## 2021-09-10 DIAGNOSIS — F4321 Adjustment disorder with depressed mood: Secondary | ICD-10-CM | POA: Diagnosis not present

## 2021-09-17 DIAGNOSIS — D225 Melanocytic nevi of trunk: Secondary | ICD-10-CM | POA: Diagnosis not present

## 2021-09-17 DIAGNOSIS — L720 Epidermal cyst: Secondary | ICD-10-CM | POA: Diagnosis not present

## 2021-09-17 DIAGNOSIS — L918 Other hypertrophic disorders of the skin: Secondary | ICD-10-CM | POA: Diagnosis not present

## 2021-09-17 DIAGNOSIS — L853 Xerosis cutis: Secondary | ICD-10-CM | POA: Diagnosis not present

## 2021-09-17 DIAGNOSIS — D2362 Other benign neoplasm of skin of left upper limb, including shoulder: Secondary | ICD-10-CM | POA: Diagnosis not present

## 2021-09-17 DIAGNOSIS — D2361 Other benign neoplasm of skin of right upper limb, including shoulder: Secondary | ICD-10-CM | POA: Diagnosis not present

## 2021-09-17 DIAGNOSIS — L821 Other seborrheic keratosis: Secondary | ICD-10-CM | POA: Diagnosis not present

## 2021-09-17 DIAGNOSIS — F4321 Adjustment disorder with depressed mood: Secondary | ICD-10-CM | POA: Diagnosis not present

## 2021-09-17 DIAGNOSIS — D2272 Melanocytic nevi of left lower limb, including hip: Secondary | ICD-10-CM | POA: Diagnosis not present

## 2021-09-17 DIAGNOSIS — D2371 Other benign neoplasm of skin of right lower limb, including hip: Secondary | ICD-10-CM | POA: Diagnosis not present

## 2021-09-24 DIAGNOSIS — F4321 Adjustment disorder with depressed mood: Secondary | ICD-10-CM | POA: Diagnosis not present

## 2021-09-26 ENCOUNTER — Other Ambulatory Visit (HOSPITAL_BASED_OUTPATIENT_CLINIC_OR_DEPARTMENT_OTHER): Payer: Self-pay

## 2021-09-26 DIAGNOSIS — Z6827 Body mass index (BMI) 27.0-27.9, adult: Secondary | ICD-10-CM | POA: Diagnosis not present

## 2021-09-26 DIAGNOSIS — M546 Pain in thoracic spine: Secondary | ICD-10-CM | POA: Diagnosis not present

## 2021-09-26 DIAGNOSIS — M40294 Other kyphosis, thoracic region: Secondary | ICD-10-CM | POA: Diagnosis not present

## 2021-09-26 DIAGNOSIS — M7918 Myalgia, other site: Secondary | ICD-10-CM | POA: Diagnosis not present

## 2021-10-01 DIAGNOSIS — F4321 Adjustment disorder with depressed mood: Secondary | ICD-10-CM | POA: Diagnosis not present

## 2021-10-08 ENCOUNTER — Other Ambulatory Visit (HOSPITAL_BASED_OUTPATIENT_CLINIC_OR_DEPARTMENT_OTHER): Payer: Self-pay

## 2021-10-08 DIAGNOSIS — F4321 Adjustment disorder with depressed mood: Secondary | ICD-10-CM | POA: Diagnosis not present

## 2021-10-09 ENCOUNTER — Other Ambulatory Visit (HOSPITAL_BASED_OUTPATIENT_CLINIC_OR_DEPARTMENT_OTHER): Payer: Self-pay

## 2021-10-10 ENCOUNTER — Other Ambulatory Visit (HOSPITAL_BASED_OUTPATIENT_CLINIC_OR_DEPARTMENT_OTHER): Payer: Self-pay

## 2021-10-15 DIAGNOSIS — Z6827 Body mass index (BMI) 27.0-27.9, adult: Secondary | ICD-10-CM | POA: Diagnosis not present

## 2021-10-15 DIAGNOSIS — Z01419 Encounter for gynecological examination (general) (routine) without abnormal findings: Secondary | ICD-10-CM | POA: Diagnosis not present

## 2021-10-15 DIAGNOSIS — F4321 Adjustment disorder with depressed mood: Secondary | ICD-10-CM | POA: Diagnosis not present

## 2021-10-22 DIAGNOSIS — F4321 Adjustment disorder with depressed mood: Secondary | ICD-10-CM | POA: Diagnosis not present

## 2021-10-25 DIAGNOSIS — K117 Disturbances of salivary secretion: Secondary | ICD-10-CM | POA: Diagnosis not present

## 2021-10-25 DIAGNOSIS — J342 Deviated nasal septum: Secondary | ICD-10-CM | POA: Diagnosis not present

## 2021-10-25 DIAGNOSIS — B370 Candidal stomatitis: Secondary | ICD-10-CM | POA: Diagnosis not present

## 2021-10-25 DIAGNOSIS — K146 Glossodynia: Secondary | ICD-10-CM | POA: Diagnosis not present

## 2021-10-31 ENCOUNTER — Other Ambulatory Visit (HOSPITAL_BASED_OUTPATIENT_CLINIC_OR_DEPARTMENT_OTHER): Payer: Self-pay

## 2021-11-07 ENCOUNTER — Other Ambulatory Visit (HOSPITAL_BASED_OUTPATIENT_CLINIC_OR_DEPARTMENT_OTHER): Payer: Self-pay

## 2021-11-09 DIAGNOSIS — I10 Essential (primary) hypertension: Secondary | ICD-10-CM | POA: Diagnosis not present

## 2021-11-09 DIAGNOSIS — E1169 Type 2 diabetes mellitus with other specified complication: Secondary | ICD-10-CM | POA: Diagnosis not present

## 2021-11-09 DIAGNOSIS — E782 Mixed hyperlipidemia: Secondary | ICD-10-CM | POA: Diagnosis not present

## 2021-11-12 DIAGNOSIS — R0602 Shortness of breath: Secondary | ICD-10-CM | POA: Diagnosis not present

## 2021-11-12 DIAGNOSIS — G4730 Sleep apnea, unspecified: Secondary | ICD-10-CM | POA: Diagnosis not present

## 2021-11-12 DIAGNOSIS — E1169 Type 2 diabetes mellitus with other specified complication: Secondary | ICD-10-CM | POA: Diagnosis not present

## 2021-11-12 DIAGNOSIS — F064 Anxiety disorder due to known physiological condition: Secondary | ICD-10-CM | POA: Diagnosis not present

## 2021-11-14 ENCOUNTER — Ambulatory Visit: Payer: Medicare PPO | Admitting: Cardiology

## 2021-11-14 ENCOUNTER — Encounter: Payer: Self-pay | Admitting: Cardiology

## 2021-11-14 VITALS — BP 116/79 | HR 93 | Temp 97.8°F | Resp 16 | Ht 64.0 in | Wt 153.0 lb

## 2021-11-14 DIAGNOSIS — R0602 Shortness of breath: Secondary | ICD-10-CM

## 2021-11-14 DIAGNOSIS — I10 Essential (primary) hypertension: Secondary | ICD-10-CM | POA: Diagnosis not present

## 2021-11-14 NOTE — Progress Notes (Signed)
? ?Primary Physician/Referring:  Mary Lei, MD ? ?Patient ID: Mary Choi, female    DOB: 05/27/1951, 71 y.o.   MRN: [id number] ? ?Chief Complaint  ?Patient presents with  ? Shortness of Breath  ? Follow-up  ?  3 months  ? ? ?HPI:   ? ?Mary Choi  is a 71 y.o. AAfrican-American female patient with hypertension, hyperlipidemia, controlled diabetes mellitus without complication, mild reactive airway disease. She has history of history of breast cancer in situ SP mastectomy, there was no chemo or radiation therapy. ? ?On her last office visit 3 months ago, both hypertension, dyspnea on exertion and also palpitations and elevated heart rate I had changed her to diltiazem CD.  She is essentially asymptomatic.  ? ?Past Medical History:  ?Diagnosis Date  ? Allergy   ? Anemia   ? Asthma   ? Breast cancer ([place]) 1996  ? Cancer [facility])   ? Diabetes mellitus without complication ([place])   ? Hyperlipidemia   ? Hypertension   ? ?Past Surgical History:  ?Procedure Laterality Date  ? ABDOMINAL HYSTERECTOMY    ? BREAST SURGERY    ? CESAREAN SECTION    ? MASTECTOMY Right   ? ORIF ANKLE FRACTURE Right 01/31/2015  ? Procedure: OPEN REDUCTION INTERNAL FIXATION (ORIF) ANKLE FRACTURE;  Surgeon: Renette Butters, MD;  Location: [facility];  Service: Orthopedics;  Laterality: Right;  ? ?Family History  ?Problem Relation Age of Onset  ? Diabetes Mother   ? Hypertension Mother   ? Heart disease Brother   ? Pancreatic cancer Maternal Grandfather   ? Colon cancer Neg Hx   ? Esophageal cancer Neg Hx   ? Liver cancer Neg Hx   ? Rectal cancer Neg Hx   ? Stomach cancer Neg Hx   ?  ?Social History  ? ?Tobacco Use  ? Smoking status: Never  ? Smokeless tobacco: Never  ?Substance Use Topics  ? Alcohol use: Yes  ?  Comment: social   ? ?Marital Status: Widowed  ?ROS  ?Review of Systems  ?Cardiovascular:  Negative for chest pain, dyspnea on exertion and leg swelling.  ?Objective  ?Blood pressure 116/79, pulse 93, temperature 97.8 ?F  (36.6 ?C), temperature source Temporal, resp. rate 16, height '5\' 4"'  (1.626 m), weight 153 lb (69.4 kg). Body mass index is 26.26 kg/m?.  ? ?  11/14/2021  ?  1:32 PM 08/15/2021  ?  3:34 PM 08/15/2021  ?  3:31 PM  ?Vitals with BMI  ?Height '5\' 4"'   '5\' 4"'   ?Weight 153 lbs  155 lbs 3 oz  ?BMI 26.25  26.63  ?Systolic 409 811 914  ?Diastolic 79 80 80  ?Pulse 93 90 87  ?  ? Physical Exam  ?Eyes: Conjunctivae are normal.  ?Neck: No JVD present.  ?Cardiovascular: Regular rhythm, intact distal pulses and normal pulses. Exam reveals no gallop.  ?Murmur heard. ?Midsystolic murmur is present with a grade of 2/6 at the lower left sternal border and apex. ?Pulmonary/Chest: Effort normal and breath sounds normal. She exhibits no tenderness.  ?Abdominal: Soft. Bowel sounds are normal.  ?Skin: Skin is warm.   ?Physical exam unchanged compared to previous office visit. ? ?Laboratory examination:  ? ?No results for input(s): NA, K, CL, CO2, GLUCOSE, BUN, CREATININE, CALCIUM, GFRNONAA, GFRAA in the last 8760 hours. ? ?CrCl cannot be calculated (Patient's most recent lab result is older than the maximum 21 days allowed.).  ? ?  Latest Ref Rng & Units 07/17/2020  ?  11:21 AM 01/25/2015  ?  3:48 PM 08/26/2009  ?  6:49 PM  ?CMP  ?Glucose 70 - 99 mg/dL 182   193   124    ?BUN 6 - 23 mg/dL '19   15   20    ' ?Creatinine 0.40 - 1.20 mg/dL 0.86   0.89   0.7    ?Sodium 135 - 145 mEq/L 137   138   141    ?Potassium 3.5 - 5.1 mEq/L 3.7   4.4   3.7    ?Chloride 96 - 112 mEq/L 100   101   108    ?CO2 19 - 32 mEq/L 28   28     ?Calcium 8.4 - 10.5 mg/dL 9.6   9.3     ? ?External labs:  ? ?Labs 11/08/2020: ? ?Total cholesterol 166, triglycerides 43, HDL 82, LDL 72.  Non-HDL cholesterol 84. ? ?Serum glucose 126 mg, BUN 19, creatinine 0.79, EGFR 87 mL.  Sodium 141, potassium 4.1, AST 50, ALT 32, mildly elevated.  Otherwise CMP normal. ? ?Hb 9.6/HCT 32.7, platelets 129.  Normal indicis. ? ?A1c 7.1%. ? ?Allergies  ?No Known Allergies  ? ?Final Medications at End of  Visit   ? ? ?Current Outpatient Medications:  ?  albuterol (PROVENTIL HFA;VENTOLIN HFA) 108 (90 BASE) MCG/ACT inhaler, Inhale 2 puffs into the lungs every 6 (six) hours as needed for wheezing., Disp: , Rfl:  ?  baclofen (LIORESAL) 10 MG tablet, Take 10 mg by mouth 4 (four) times daily., Disp: , Rfl:  ?  cholecalciferol (VITAMIN D3) 25 MCG (1000 UNIT) tablet, Take 1,000 Units by mouth daily., Disp: , Rfl:  ?  diltiazem (CARDIZEM CD) 240 MG 24 hr capsule, Take 1 capsule (240 mg total) by mouth daily., Disp: 90 capsule, Rfl: 3 ?  insulin glargine, 2 Unit Dial, (TOUJEO MAX SOLOSTAR) 300 UNIT/ML Solostar Pen, Inject 10 Units into the skin at bedtime., Disp: , Rfl:  ?  losartan-hydrochlorothiazide (HYZAAR) 100-25 MG per tablet, Take 1 tablet by mouth daily., Disp: , Rfl:  ?  metFORMIN (GLUCOPHAGE) 1000 MG tablet, Take 1,000 mg by mouth at bedtime. , Disp: , Rfl:  ?  pioglitazone (ACTOS) 15 MG tablet, Take 15 mg by mouth every other day. , Disp: , Rfl:  ?  Potassium Chloride Crys ER (KLOR-CON M20 PO), Take 1 tablet by mouth daily at 12 noon., Disp: , Rfl:  ?  rosuvastatin (CRESTOR) 10 MG tablet, Take 10 mg by mouth daily., Disp: , Rfl:  ?  Semaglutide,0.25 or 0.5MG/DOS, 2 MG/1.5ML SOPN, Ozempic 0.25 mg or 0.5 mg (2 mg/1.5 mL) subcutaneous pen injector, Disp: , Rfl:   ? ?Radiology:  ? ?Colonoscopy 11/14/2020: ?- Mild pancolonic diverticulosis. ?                          - Non-bleeding internal hemorrhoids. ?                          - The examination was otherwise normal on direct  ?                          and retroflexion views. ? ?CT scan of the chest 11/18/2019: ?No significant extracardiac abnormality. ?Normal heart size, mitral annular calcification present.  Mild atherosclerotic calcification of the proximal descending thoracic aorta.  Tortuous right subclavian artery.  This explains for focal nodular opacity  seen on chest x-ray. ? ? ?Cardiac Studies:  ?PCV ECHOCARDIOGRAM COMPLETE 12/19/2020 ?Left ventricle cavity is  normal in size. Mild concentric hypertrophy of the left ventricle. Normal global wall motion. Normal LV systolic function with EF 59%. Doppler evidence of grade I (impaired) diastolic dysfunction, normal LAP. ?Mild (Grade I) mitral regurgitation. ?Mild to moderate tricuspid regurgitation. Estimated pulmonary artery systolic pressure 26 mmHg. ? ?Exercise tetrofosmin stress test  12/27/2020: ?Normal ECG stress. The patient exercised for 4 minutes and 28 seconds of a Bruce protocol, achieving approximately 6.41 METs.  The calculated Duke Treadmill Score is 4.47. Normal BP response. Exercise capacity reduced. ?Myocardial perfusion is normal. ?Overall LV systolic function is normal without regional wall motion abnormalities. Stress LV EF: 74%.  ?No previous exam available for comparison. Low risk. ?  ?EKG:  ? ?EKG 07/03/2021: Normal sinus rhythm at rate of 92 bpm, left atrial enlargement, left axis deviation, left anterior fascicular block.  Poor R wave progression, cannot exclude anteroseptal infarct old.  No significant change from prior EKG 12/07/2020. ? ?Assessment  ? ?  ICD-10-CM   ?1. SOB (shortness of breath)  R06.02   ?  ?2. Primary hypertension  I10   ?  ?  ?Medications Discontinued During This Encounter  ?Medication Reason  ? Semaglutide,0.25 or 0.5MG/DOS, (OZEMPIC, 0.25 OR 0.5 MG/DOSE,) 2 MG/1.5ML SOPN   ? ?  ?No orders of the defined types were placed in this encounter. ? ? ?No orders of the defined types were placed in this encounter. ? ? ?Recommendations:  ? ?GEORJEAN TOYA is a 71 y.o. African-American female patient with hypertension, hyperlipidemia, controlled diabetes mellitus without complication, mild reactive airway disease. She has history of history of breast cancer in situ SP mastectomy, there was no chemo or radiation therapy. ? ?On her last office visit 3 months ago, both hypertension, dyspnea on exertion and also palpitations and elevated heart rate I had changed her to diltiazem CD.  Patient  states that since doing this, blood pressure has been under excellent control, she has no further episodes of palpitations and she has been trying to exercise on a regular basis and feels overall much improve

## 2021-11-26 DIAGNOSIS — K146 Glossodynia: Secondary | ICD-10-CM | POA: Diagnosis not present

## 2021-11-26 DIAGNOSIS — B370 Candidal stomatitis: Secondary | ICD-10-CM | POA: Diagnosis not present

## 2021-11-26 DIAGNOSIS — R682 Dry mouth, unspecified: Secondary | ICD-10-CM | POA: Diagnosis not present

## 2021-11-26 HISTORY — DX: Glossodynia: K14.6

## 2021-11-27 ENCOUNTER — Other Ambulatory Visit (HOSPITAL_BASED_OUTPATIENT_CLINIC_OR_DEPARTMENT_OTHER): Payer: Self-pay

## 2021-11-30 ENCOUNTER — Other Ambulatory Visit (HOSPITAL_BASED_OUTPATIENT_CLINIC_OR_DEPARTMENT_OTHER): Payer: Self-pay

## 2022-01-01 ENCOUNTER — Ambulatory Visit (INDEPENDENT_AMBULATORY_CARE_PROVIDER_SITE_OTHER)
Admission: EM | Admit: 2022-01-01 | Discharge: 2022-01-01 | Disposition: A | Discharge status: Other Acute Inpatient Hospital | Payer: Medicare PPO | Source: Home / Self Care

## 2022-01-01 ENCOUNTER — Emergency Department (HOSPITAL_COMMUNITY)
Admission: EM | Admit: 2022-01-01 | Discharge: 2022-01-01 | Disposition: A | Discharge status: Home / Self Care | Payer: Medicare PPO | Attending: Emergency Medicine | Admitting: Emergency Medicine

## 2022-01-01 ENCOUNTER — Other Ambulatory Visit: Payer: Self-pay

## 2022-01-01 ENCOUNTER — Emergency Department (HOSPITAL_COMMUNITY): Payer: Medicare PPO

## 2022-01-01 DIAGNOSIS — Z7984 Long term (current) use of oral hypoglycemic drugs: Secondary | ICD-10-CM | POA: Insufficient documentation

## 2022-01-01 DIAGNOSIS — R0602 Shortness of breath: Secondary | ICD-10-CM | POA: Insufficient documentation

## 2022-01-01 DIAGNOSIS — E119 Type 2 diabetes mellitus without complications: Secondary | ICD-10-CM | POA: Insufficient documentation

## 2022-01-01 DIAGNOSIS — R42 Dizziness and giddiness: Secondary | ICD-10-CM | POA: Diagnosis not present

## 2022-01-01 DIAGNOSIS — Z79899 Other long term (current) drug therapy: Secondary | ICD-10-CM | POA: Insufficient documentation

## 2022-01-01 DIAGNOSIS — I10 Essential (primary) hypertension: Secondary | ICD-10-CM | POA: Diagnosis not present

## 2022-01-01 DIAGNOSIS — Z794 Long term (current) use of insulin: Secondary | ICD-10-CM | POA: Diagnosis not present

## 2022-01-01 DIAGNOSIS — Z853 Personal history of malignant neoplasm of breast: Secondary | ICD-10-CM | POA: Diagnosis not present

## 2022-01-01 LAB — BASIC METABOLIC PANEL
Anion gap: 10 (ref 5–15)
BUN: 20 mg/dL (ref 8–23)
CO2: 26 mmol/L (ref 22–32)
Calcium: 9.6 mg/dL (ref 8.9–10.3)
Chloride: 104 mmol/L (ref 98–111)
Creatinine, Ser: 0.94 mg/dL (ref 0.44–1.00)
GFR, Estimated: >60 mL/min (ref >60)
Glucose, Bld: 117 mg/dL — ABNORMAL HIGH (ref 70–99)
Potassium: 4 mmol/L (ref 3.5–5.1)
Sodium: 140 mmol/L (ref 135–145)

## 2022-01-01 LAB — TROPONIN I (HIGH SENSITIVITY)
Troponin I (High Sensitivity): 7 ng/L (ref <18)
Troponin I (High Sensitivity): 8 ng/L (ref <18)

## 2022-01-01 LAB — CBC WITH DIFFERENTIAL/PLATELET
Abs Immature Granulocytes: 0.02 10*3/uL (ref 0.00–0.07)
Basophils Absolute: 0.1 10*3/uL (ref 0.0–0.1)
Basophils Relative: 1 %
Eosinophils Absolute: 0.1 10*3/uL (ref 0.0–0.5)
Eosinophils Relative: 1 %
HCT: 39.6 % (ref 36.0–46.0)
Hemoglobin: 12.6 g/dL (ref 12.0–15.0)
Immature Granulocytes: 0 %
Lymphocytes Relative: 25 %
Lymphs Abs: 1.6 10*3/uL (ref 0.7–4.0)
MCH: 30.6 pg (ref 26.0–34.0)
MCHC: 31.8 g/dL (ref 30.0–36.0)
MCV: 96.1 fL (ref 80.0–100.0)
Monocytes Absolute: 0.4 10*3/uL (ref 0.1–1.0)
Monocytes Relative: 6 %
Neutro Abs: 4.1 10*3/uL (ref 1.7–7.7)
Neutrophils Relative %: 67 %
Platelets: 213 10*3/uL (ref 150–400)
RBC: 4.12 MIL/uL (ref 3.87–5.11)
RDW: 13.8 % (ref 11.5–15.5)
WBC: 6.3 10*3/uL (ref 4.0–10.5)
nRBC: 0 % (ref 0.0–0.2)

## 2022-01-01 LAB — BRAIN NATRIURETIC PEPTIDE: B Natriuretic Peptide: 23.6 pg/mL (ref 0.0–100.0)

## 2022-01-01 MED ORDER — IOHEXOL 350 MG/ML SOLN
100.0000 mL | Freq: Once | INTRAVENOUS | Status: AC | PRN
  Administered 2022-01-01: 100 mL via INTRAVENOUS

## 2022-01-01 NOTE — ED Triage Notes (Signed)
Pt c/o SOB x20 mins while getting ready for work. States had the same episode last week lasting 30-45 mins. Pt speaking in complete sentences having tachypnea at times. States having nausea and lightheaded.

## 2022-01-01 NOTE — Discharge Instructions (Signed)
You came to the emergency department today to be evaluated for your shortness of breath and chest pain.  Your physical exam was reassuring.  Your troponin lab testing was within normal limits which is reassuring you are not having acute heart attack today.  The CTA scan of your chest did not show any signs of blood clots.  It did show stable tiny right lung nodules, consistent with benign etiology.  Please follow-up with your primary care doctor and your cardiologist for repeat evaluation.  Get help right away if: Your shortness of breath gets worse. You have shortness of breath when you are resting. You feel light-headed or you faint. You have a cough that is not controlled with medicines. You cough up blood. You have pain with breathing. You have pain in your chest, arms, shoulders, or abdomen. You have a fever.

## 2022-01-01 NOTE — ED Provider Notes (Signed)
[facility] Provider Note   CSN: [id number] Arrival date & time: 01/01/22  1016     History  Chief Complaint  Patient presents with   Shortness of Breath   Dizziness    Mary Choi is a 71 y.o. female with diabetes mellitus, hypertension, hyperlipidemia, anemia, right breast cancer status postmastectomy 1996.  Presents to the emergency department complaint of shortness of breath and lightheadedness.  Patient states that this morning while getting dressed for work approximately 8:30 AM she began feeling very short of breath and lightheaded.  This lasted for approximately 30 to 40 minutes before resolving.  Patient did not have any associated palpitations, chest pain, syncope, nausea, vomiting, abdominal pain, diaphoresis.    Patient states that she had a similar episode that occurred approximate 1 week prior.  Patient went to urgent care was directed to the emergency department for further evaluation.    Patient does endorse chronic swelling to right lower extremity secondary to lymphedema.  Denies any new swelling.     Shortness of Breath Associated symptoms: no abdominal pain, no chest pain, no fever, no headaches, no neck pain, no rash and no vomiting   Dizziness Associated symptoms: shortness of breath   Associated symptoms: no chest pain, no headaches, no nausea, no palpitations and no vomiting       Home Medications Prior to Admission medications   Medication Sig Start Date End Date Taking? Authorizing Provider  albuterol (PROVENTIL HFA;VENTOLIN HFA) 108 (90 BASE) MCG/ACT inhaler Inhale 2 puffs into the lungs every 6 (six) hours as needed for wheezing.    [provider]  baclofen (LIORESAL) 10 MG tablet Take 10 mg by mouth 4 (four) times daily.    [provider]  cholecalciferol (VITAMIN D3) 25 MCG (1000 UNIT) tablet Take 1,000 Units by mouth daily.    [provider]  diltiazem (CARDIZEM CD) 240 MG 24  hr capsule Take 1 capsule (240 mg total) by mouth daily. 08/15/21 08/10/22  Cantwell, Celeste C, PA-C  fluconazole (DIFLUCAN) 200 MG tablet Take 200 mg by mouth daily. 08/28/21   [provider]  insulin glargine, 2 Unit Dial, (TOUJEO MAX SOLOSTAR) 300 UNIT/ML Solostar Pen Inject 10 Units into the skin at bedtime.    [provider]  losartan-hydrochlorothiazide (HYZAAR) 100-25 MG per tablet Take 1 tablet by mouth daily.    [provider]  metFORMIN (GLUCOPHAGE) 1000 MG tablet Take 1,000 mg by mouth at bedtime.     [provider]  pioglitazone (ACTOS) 15 MG tablet Take 15 mg by mouth every other day.     [provider]  Potassium Chloride Crys ER (KLOR-CON M20 PO) Take 1 tablet by mouth daily at 12 noon.    [provider]  rosuvastatin (CRESTOR) 10 MG tablet Take 10 mg by mouth daily.    [provider]  Semaglutide,0.25 or 0.'5MG'$ /DOS, (OZEMPIC, 0.25 OR 0.5 MG/DOSE,) 2 MG/1.5ML SOPN Inject 0.'5mg'$  under the skin once weekly 08/28/21     Semaglutide,0.25 or 0.'5MG'$ /DOS, 2 MG/1.5ML SOPN Ozempic 0.25 mg or 0.5 mg (2 mg/1.5 mL) subcutaneous pen injector    [provider]      Allergies    Patient has no known allergies.    Review of Systems   Review of Systems  Constitutional:  Negative for chills and fever.  Eyes:  Negative for visual disturbance.  Respiratory:  Positive for shortness of breath.   Cardiovascular:  Positive for leg swelling (right  leg lymphedema baseline). Negative for chest pain and palpitations.  Gastrointestinal:  Negative for abdominal pain, nausea and vomiting.  Genitourinary:  Negative for difficulty urinating and dysuria.  Musculoskeletal:  Negative for back pain and neck pain.  Skin:  Negative for color change and rash.  Neurological:  Positive for dizziness and light-headedness. Negative for syncope and headaches.  Psychiatric/Behavioral:  Negative for confusion.    Physical Exam Updated Vital  Signs BP 132/84 (BP Location: Left Arm)   Pulse 93   Temp 98.5 F (36.9 C)   Resp 15   Ht 5' 4.5" (1.638 m)   Wt 68.9 kg   SpO2 100%   BMI 25.69 kg/m  Physical Exam Vitals and nursing note reviewed.  Constitutional:      General: She is not in acute distress.    Appearance: She is not ill-appearing, toxic-appearing or diaphoretic.  HENT:     Head: Normocephalic.  Eyes:     General: No scleral icterus.       Right eye: No discharge.        Left eye: No discharge.  Cardiovascular:     Rate and Rhythm: Normal rate.     Pulses:          Radial pulses are 2+ on the right side and 2+ on the left side.     Heart sounds: Murmur heard.  Systolic murmur is present.  Pulmonary:     Effort: Pulmonary effort is normal. No tachypnea, bradypnea or respiratory distress.     Breath sounds: Normal breath sounds. No stridor.     Comments: Speaks in full complete sentences without difficulty. Musculoskeletal:     Right lower leg: No swelling, deformity, lacerations, tenderness or bony tenderness. No edema.     Left lower leg: No swelling, deformity, lacerations, tenderness or bony tenderness. No edema.  Skin:    General: Skin is warm and dry.  Neurological:     General: No focal deficit present.     Mental Status: She is alert.  Psychiatric:        Behavior: Behavior is cooperative.    ED Results / Procedures / Treatments   Labs (all labs ordered are listed, but only abnormal results are displayed) Labs Reviewed  BASIC METABOLIC PANEL - Abnormal; Notable for the following components:      Result Value   Glucose, Bld 117 (*)    All other components within normal limits  CBC WITH DIFFERENTIAL/PLATELET  BRAIN NATRIURETIC PEPTIDE  TROPONIN I (HIGH SENSITIVITY)  TROPONIN I (HIGH SENSITIVITY)    EKG None  Radiology DG Chest 2 View  Result Date: 01/01/2022 CLINICAL DATA:  Shortness of breath EXAM: CHEST - 2 VIEW COMPARISON:  November 2021 FINDINGS: No new consolidation or edema.  No pleural effusion or pneumothorax. Stable cardiomediastinal contours with normal heart size. No acute osseous abnormality. IMPRESSION: No acute process in the chest. Electronically Signed   By: Macy Mis M.D.   On: 01/01/2022 11:32    Procedures Procedures    Medications Ordered in ED Medications - No data to display  ED Course/ Medical Decision Making/ A&P                           Medical Decision Making Amount and/or Complexity of Data Reviewed Radiology: ordered.  Risk Prescription drug management.   Alert 71 year old female in no acute distress, nontoxic-appearing.  Presents to the emergency department with a complaint of shortness of breath  and lightheadedness.  Information obtained from patient.  I reviewed patient's past medical records including previous bladder notes, labs, and imaging.  Patient has medical history as outlined in HPI which complicates her care.  With shortness of breath and lightheadedness differential includes but is not limited to ACS, pneumothorax, pneumonia, PE.  Work-up was initiated while patient was in triage.  I personally viewed and interpreted patient's EKG.  Tracing shows sinus sinus tachycardia, appears similar to previous tracing.  I personally viewed and interpreted patient's chest x-ray.  X-ray imaging shows no active cardiopulmonary disease.  I personally viewed and interpreted patient's lab results.  Pertinent findings include: -Troponin 7 and 8 with delta of +1 -BNP 23.6 -BMP and CBC unremarkable  I personally viewed and interpreted patient's CTA chest.  Agree with radiology interpretation of no PE, stable tiny right lung nodules consistent with benign etiology, stable T4 compression deformity.  Patient continues to have no episodes of shortness of breath while in the emergency department.  We will discharge to follow-up with PCP and cardiology.  Patient was discussed with and evaluated by Dr. Jeanell Sparrow.  Based on patient's  chief complaint, I considered admission might be necessary, however after reassuring ED workup feel patient is reasonable for discharge.  Discussed results, findings, treatment and follow up. Patient advised of return precautions. Patient verbalized understanding and agreed with plan.  Portions of this note were generated with [employer]. Dictation errors may occur despite best attempts at proofreading.          Final Clinical Impression(s) / ED Diagnoses Final diagnoses:  None    Rx / DC Orders ED Discharge Orders     None         Loni Beckwith, PA-C 01/01/22 1450    Pattricia Boss, MD 01/02/22 [phone]

## 2022-01-01 NOTE — ED Notes (Signed)
Pt verbalized understanding of d/c instructions, meds, and followup care. Denies questions. VSS, no distress noted. Steady gait to exit with all belongings.  ?

## 2022-01-01 NOTE — ED Notes (Signed)
Patient is being discharged from the Urgent Care and sent to the Emergency Department via POV . Per LG, patient is in need of higher level of care due to SOB. Patient is aware and verbalizes understanding of plan of care.  Vitals:   01/01/22 0919  BP: 128/86  Pulse: 98  Resp: (!) 26  Temp: 97.7 F (36.5 C)  SpO2: 99%

## 2022-01-01 NOTE — ED Provider Notes (Signed)
[facility]    CSN: [id number] Arrival date & time: 01/01/22  0910      History   Chief Complaint Chief Complaint  Patient presents with   Shortness of Breath    HPI Mary Choi is a 71 y.o. female presenting with episode of dyspnea for the last 40 minutes since getting ready for work.  History of hypertension, hyperlipidemia, asthma.  She did have 1 other similar episode that terminated on its own about 1 week ago, she states she was worked up for this at her cardiologist office last week, but they did not find anything wrong.  She describes dyspnea, worse with exertion for the last 40 minutes.  It started while getting ready for work, and has persisted.  She does have an albuterol inhaler at home that she has not attempted.  This is associated with lightheadedness, but no overt dizziness.  Denies chest pain, pedal edema.   HPI  Past Medical History:  Diagnosis Date   Allergy    Anemia    Asthma    Breast cancer ([place]) 1996   Cancer ([place])    Diabetes mellitus without complication ([place])    Hyperlipidemia    Hypertension     Patient Active Problem List   Diagnosis Date Noted   Dislocation 01/26/2015   Ankle fracture 01/25/2015   HTN (hypertension) 01/25/2015   HLD (hyperlipidemia) 01/25/2015   Asthma 01/25/2015   Diabetes ([place]) 01/07/2013    Past Surgical History:  Procedure Laterality Date   ABDOMINAL HYSTERECTOMY     BREAST SURGERY     CESAREAN SECTION     MASTECTOMY Right    ORIF ANKLE FRACTURE Right 01/31/2015   Procedure: OPEN REDUCTION INTERNAL FIXATION (ORIF) ANKLE FRACTURE;  Surgeon: Renette Butters, MD;  Location: [facility];  Service: Orthopedics;  Laterality: Right;    OB History   No obstetric history on file.      Home Medications    Prior to Admission medications   Medication Sig Start Date End Date Taking? Authorizing Provider  albuterol (PROVENTIL HFA;VENTOLIN HFA) 108 (90 BASE) MCG/ACT inhaler Inhale 2 puffs  into the lungs every 6 (six) hours as needed for wheezing.    [provider]  baclofen (LIORESAL) 10 MG tablet Take 10 mg by mouth 4 (four) times daily.    [provider]  cholecalciferol (VITAMIN D3) 25 MCG (1000 UNIT) tablet Take 1,000 Units by mouth daily.    [provider]  diltiazem (CARDIZEM CD) 240 MG 24 hr capsule Take 1 capsule (240 mg total) by mouth daily. 08/15/21 08/10/22  Cantwell, Celeste C, PA-C  fluconazole (DIFLUCAN) 200 MG tablet Take 200 mg by mouth daily. 08/28/21   [provider]  insulin glargine, 2 Unit Dial, (TOUJEO MAX SOLOSTAR) 300 UNIT/ML Solostar Pen Inject 10 Units into the skin at bedtime.    [provider]  losartan-hydrochlorothiazide (HYZAAR) 100-25 MG per tablet Take 1 tablet by mouth daily.    [provider]  metFORMIN (GLUCOPHAGE) 1000 MG tablet Take 1,000 mg by mouth at bedtime.     [provider]  pioglitazone (ACTOS) 15 MG tablet Take 15 mg by mouth every other day.     [provider]  Potassium Chloride Crys ER (KLOR-CON M20 PO) Take 1 tablet by mouth daily at 12 noon.    [provider]  rosuvastatin (CRESTOR) 10 MG tablet Take 10 mg by mouth daily.    [provider]  Semaglutide,0.25  or 0.'5MG'$ /DOS, (OZEMPIC, 0.25 OR 0.5 MG/DOSE,) 2 MG/1.5ML SOPN Inject 0.'5mg'$  under the skin once weekly 08/28/21     Semaglutide,0.25 or 0.'5MG'$ /DOS, 2 MG/1.5ML SOPN Ozempic 0.25 mg or 0.5 mg (2 mg/1.5 mL) subcutaneous pen injector    [provider]    Family History Family History  Problem Relation Age of Onset   Diabetes Mother    Hypertension Mother    Heart disease Brother    Pancreatic cancer Maternal Grandfather    Colon cancer Neg Hx    Esophageal cancer Neg Hx    Liver cancer Neg Hx    Rectal cancer Neg Hx    Stomach cancer Neg Hx     Social History Social History   Tobacco Use   Smoking status: Never   Smokeless tobacco: Never  Vaping Use   Vaping  Use: Never used  Substance Use Topics   Alcohol use: Yes    Comment: social    Drug use: No     Allergies   Patient has no known allergies.   Review of Systems Review of Systems  Constitutional:  Negative for appetite change, chills and fever.  HENT:  Negative for congestion, ear pain, rhinorrhea, sinus pressure, sinus pain and sore throat.   Eyes:  Negative for redness and visual disturbance.  Respiratory:  Positive for shortness of breath. Negative for cough, chest tightness and wheezing.   Cardiovascular:  Negative for chest pain and palpitations.  Gastrointestinal:  Negative for abdominal pain, constipation, diarrhea, nausea and vomiting.  Genitourinary:  Negative for dysuria, frequency and urgency.  Musculoskeletal:  Negative for myalgias.  Neurological:  Negative for dizziness, weakness and headaches.  Psychiatric/Behavioral:  Negative for confusion.   All other systems reviewed and are negative.   Physical Exam Triage Vital Signs ED Triage Vitals  Enc Vitals Group     BP 01/01/22 0919 128/86     Pulse Rate 01/01/22 0919 98     Resp 01/01/22 0919 (!) 26     Temp 01/01/22 0919 97.7 F (36.5 C)     Temp Source 01/01/22 0919 Oral     SpO2 01/01/22 0919 99 %     Weight --      Height --      Head Circumference --      Peak Flow --      Pain Score 01/01/22 0920 0     Pain Loc --      Pain Edu? --      Excl. in [place]? --    No data found.  Updated Vital Signs BP 128/86 (BP Location: Left Arm)   Pulse 98   Temp 97.7 F (36.5 C) (Oral)   Resp (!) 26   SpO2 99%   Visual Acuity Right Eye Distance:   Left Eye Distance:   Bilateral Distance:    Right Eye Near:   Left Eye Near:    Bilateral Near:     Physical Exam Vitals reviewed.  Constitutional:      Appearance: Normal appearance. She is not diaphoretic.  HENT:     Head: Normocephalic and atraumatic.     Mouth/Throat:     Mouth: Mucous membranes are moist.  Eyes:     Extraocular Movements:  Extraocular movements intact.     Pupils: Pupils are equal, round, and reactive to light.  Cardiovascular:     Rate and Rhythm: Normal rate and regular rhythm.     Pulses:  Radial pulses are 2+ on the right side and 2+ on the left side.     Heart sounds: Normal heart sounds.     Comments: No pedal edema or venous distension  Pulmonary:     Effort: Pulmonary effort is normal. Tachypnea present.     Breath sounds: Normal breath sounds. No decreased breath sounds, wheezing, rhonchi or rales.     Comments: No adventitious breath sounds. Visibly dyspneic and talking in short sentences.  Abdominal:     Palpations: Abdomen is soft.     Tenderness: There is no abdominal tenderness. There is no guarding or rebound.  Musculoskeletal:     Right lower leg: No edema.     Left lower leg: No edema.  Skin:    General: Skin is warm.     Capillary Refill: Capillary refill takes less than 2 seconds.  Neurological:     General: No focal deficit present.     Mental Status: She is alert and oriented to person, place, and time.  Psychiatric:        Mood and Affect: Mood normal.        Behavior: Behavior normal.        Thought Content: Thought content normal.        Judgment: Judgment normal.     UC Treatments / Results  Labs (all labs ordered are listed, but only abnormal results are displayed) Labs Reviewed - No data to display  EKG   Radiology No results found.  Procedures Procedures (including critical care time)  Medications Ordered in UC Medications - No data to display  Initial Impression / Assessment and Plan / UC Course  I have reviewed the triage vital signs and the nursing notes.  Pertinent labs & imaging results that were available during my care of the patient were reviewed by me and considered in my medical decision making (see chart for details).     This patient is a very pleasant 71 y.o. year old female presenting with dyspnea x40 minutes. Afebrile, nontachy.   She is visibly tachypneic, respirations 26.  No adventitious breath sounds on exam.  EKG is unchanged from 2022 EKG, though there are ST elevations in V1 and V2.  She does not have any unilateral pedal edema or venous distention, but differential does include pulmonary embolism.  Sent to ED via POV driven by cousin, she declines EMS.   Final Clinical Impressions(s) / UC Diagnoses   Final diagnoses:  Shortness of breath     Discharge Instructions      -Please head straight to the ED for further management of your shortness of breath. Make sure your cousin drives the vehicle. If your symptoms get worse on the way, including shortness of breath, new chest pain, new dizziness, etc - stop and call 911.    ED Prescriptions   None    PDMP not reviewed this encounter.   Hazel Sams, PA-C 01/01/22 [phone]

## 2022-01-01 NOTE — Discharge Instructions (Addendum)
-  Please head straight to the ED for further management of your shortness of breath. Make sure your cousin drives the vehicle. If your symptoms get worse on the way, including shortness of breath, new chest pain, new dizziness, etc - stop and call 911.

## 2022-01-01 NOTE — ED Provider Triage Note (Signed)
Emergency Medicine Provider Triage Evaluation Note  Mary Choi , a 71 y.o. female  was evaluated in triage.  Pt complains of shortness of breath onset this morning.  She notes that her shortness of breath began while she was getting dressed for work.  She had a similar symptoms last week where she also had left lower chest pain as well.  No chest pain with this episode this morning.  Was evaluated at urgent care and told to come to the ED for further evaluation of her symptoms.  Had associated nausea that has since resolved at this time.  No meds tried prior to arrival to the ED.  Denies fever, chills, vomiting.  Denies past medical history of COPD, CHF, CAD, stents.  Has a past medical history of hypertension, hyperlipidemia, diabetes, and asthma and is compliant with her medications.  Review of Systems  Positive: As per HPI above Negative:   Physical Exam  BP (!) 152/96 (BP Location: Left Arm)   Pulse 98   Temp 98 F (36.7 C) (Oral)   Resp 16   Ht 5' 4.5" (1.638 m)   Wt 68.9 kg   SpO2 99%   BMI 25.69 kg/m  Gen:   Awake, no distress   Resp:  Normal effort  MSK:   Moves extremities without difficulty  Other:  No chest wall tenderness to palpation.  Medical Decision Making  Medically screening exam initiated at 10:52 AM.  Appropriate orders placed.  Lovina Reach was informed that the remainder of the evaluation will be completed by another provider, this initial triage assessment does not replace that evaluation, and the importance of remaining in the ED until their evaluation is complete.  Work-up initiated   Angelly Spearing A, PA-C 01/01/22 1054

## 2022-01-01 NOTE — ED Triage Notes (Signed)
Pt. Stated, Mary Choi had 2 episodes of SOB and dizziness in the last week. I went to UC today and they sent me here for further evaluation.

## 2022-01-28 DIAGNOSIS — K146 Glossodynia: Secondary | ICD-10-CM | POA: Diagnosis not present

## 2022-02-04 ENCOUNTER — Other Ambulatory Visit (HOSPITAL_BASED_OUTPATIENT_CLINIC_OR_DEPARTMENT_OTHER): Payer: Self-pay

## 2022-02-04 MED ORDER — OZEMPIC (0.25 OR 0.5 MG/DOSE) 2 MG/3ML ~~LOC~~ SOPN
0.5000 mL | PEN_INJECTOR | SUBCUTANEOUS | 3 refills | Status: DC
Start: 2021-08-28 — End: 2022-09-01
  Filled 2022-02-04: qty 3, 28d supply, fill #0
  Filled 2022-02-27: qty 3, 28d supply, fill #1
  Filled 2022-05-19: qty 3, 28d supply, fill #2
  Filled 2022-07-01: qty 3, 28d supply, fill #3

## 2022-02-06 DIAGNOSIS — H0102B Squamous blepharitis left eye, upper and lower eyelids: Secondary | ICD-10-CM | POA: Diagnosis not present

## 2022-02-06 DIAGNOSIS — H04123 Dry eye syndrome of bilateral lacrimal glands: Secondary | ICD-10-CM | POA: Diagnosis not present

## 2022-02-06 DIAGNOSIS — H25813 Combined forms of age-related cataract, bilateral: Secondary | ICD-10-CM | POA: Diagnosis not present

## 2022-02-06 DIAGNOSIS — H5202 Hypermetropia, left eye: Secondary | ICD-10-CM | POA: Diagnosis not present

## 2022-02-06 DIAGNOSIS — H1045 Other chronic allergic conjunctivitis: Secondary | ICD-10-CM | POA: Diagnosis not present

## 2022-02-06 DIAGNOSIS — H0102A Squamous blepharitis right eye, upper and lower eyelids: Secondary | ICD-10-CM | POA: Diagnosis not present

## 2022-02-06 DIAGNOSIS — E119 Type 2 diabetes mellitus without complications: Secondary | ICD-10-CM | POA: Diagnosis not present

## 2022-02-07 IMAGING — MG DIGITAL SCREENING UNILAT LEFT W/ TOMO W/ CAD
4 series · 4 of 12 positions shown · non-contrast
Comparison: Previous exam(s).

CLINICAL DATA: Screening.

EXAM:
DIGITAL SCREENING UNILATERAL LEFT MAMMOGRAM WITH CAD AND
TOMOSYNTHESIS
TECHNIQUE: Left screening digital craniocaudal and mediolateral oblique
mammograms were obtained. Left screening digital breast
tomosynthesis was performed. The images were evaluated with
computer-aided detection.

[L CC synth-2D]
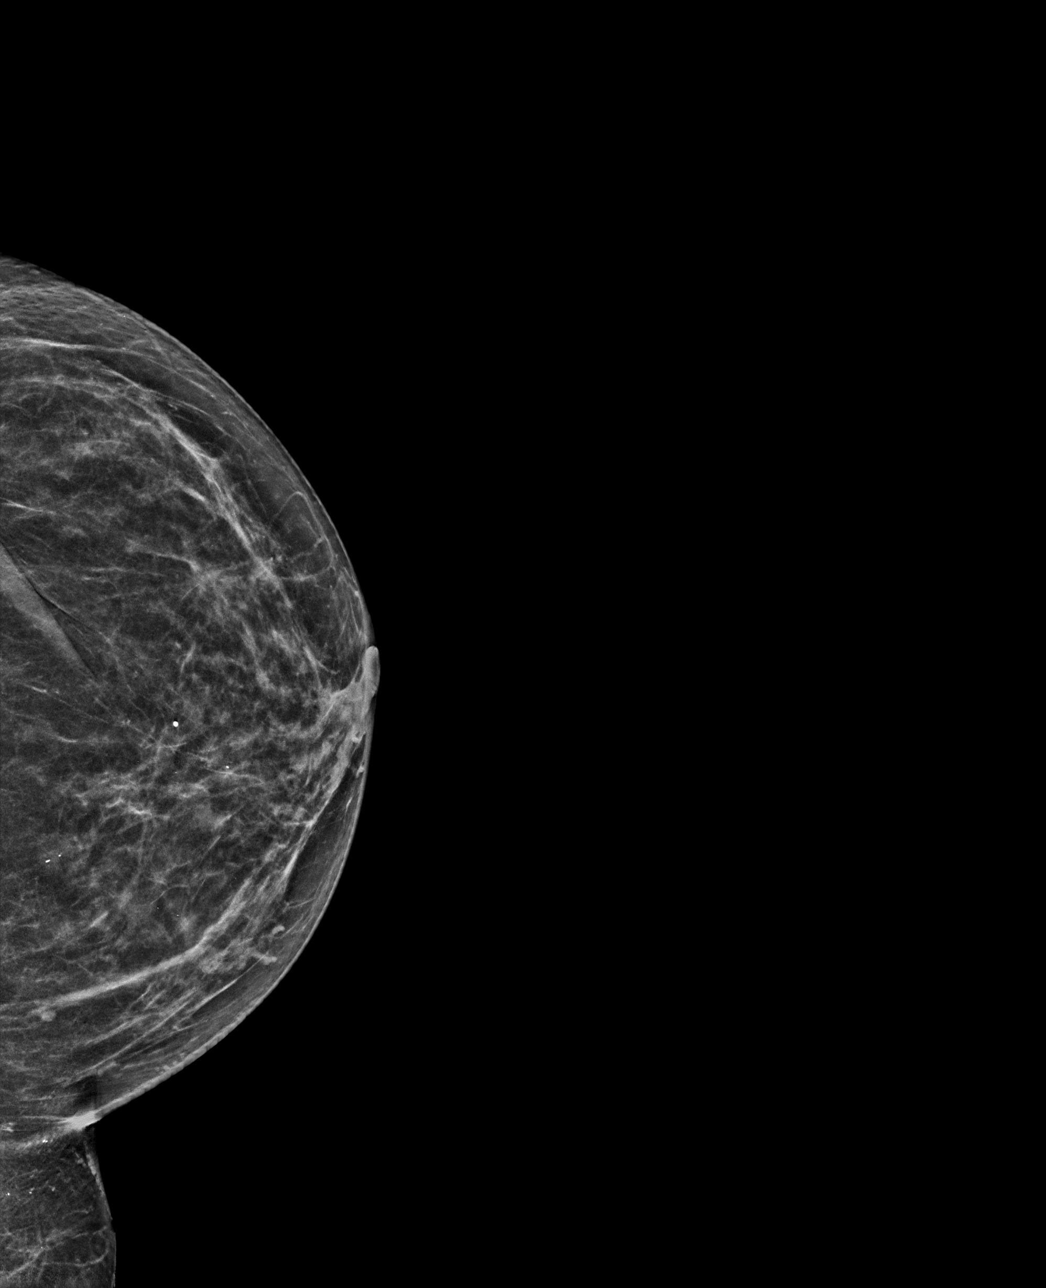

[L MLO synth-2D]
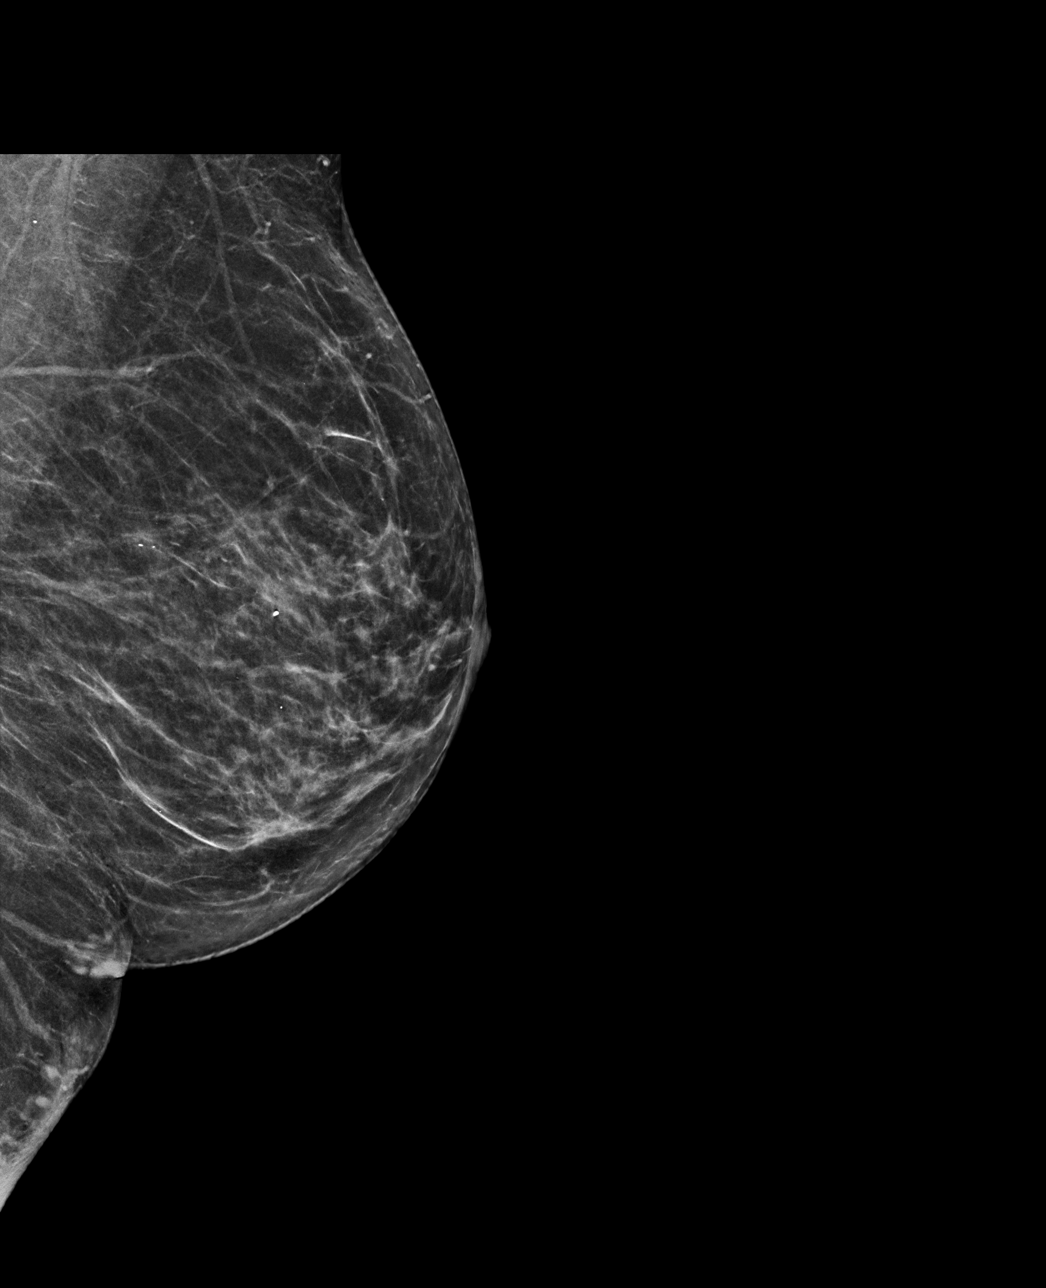

[L MLO tomo · tomo slice 31/61.0]
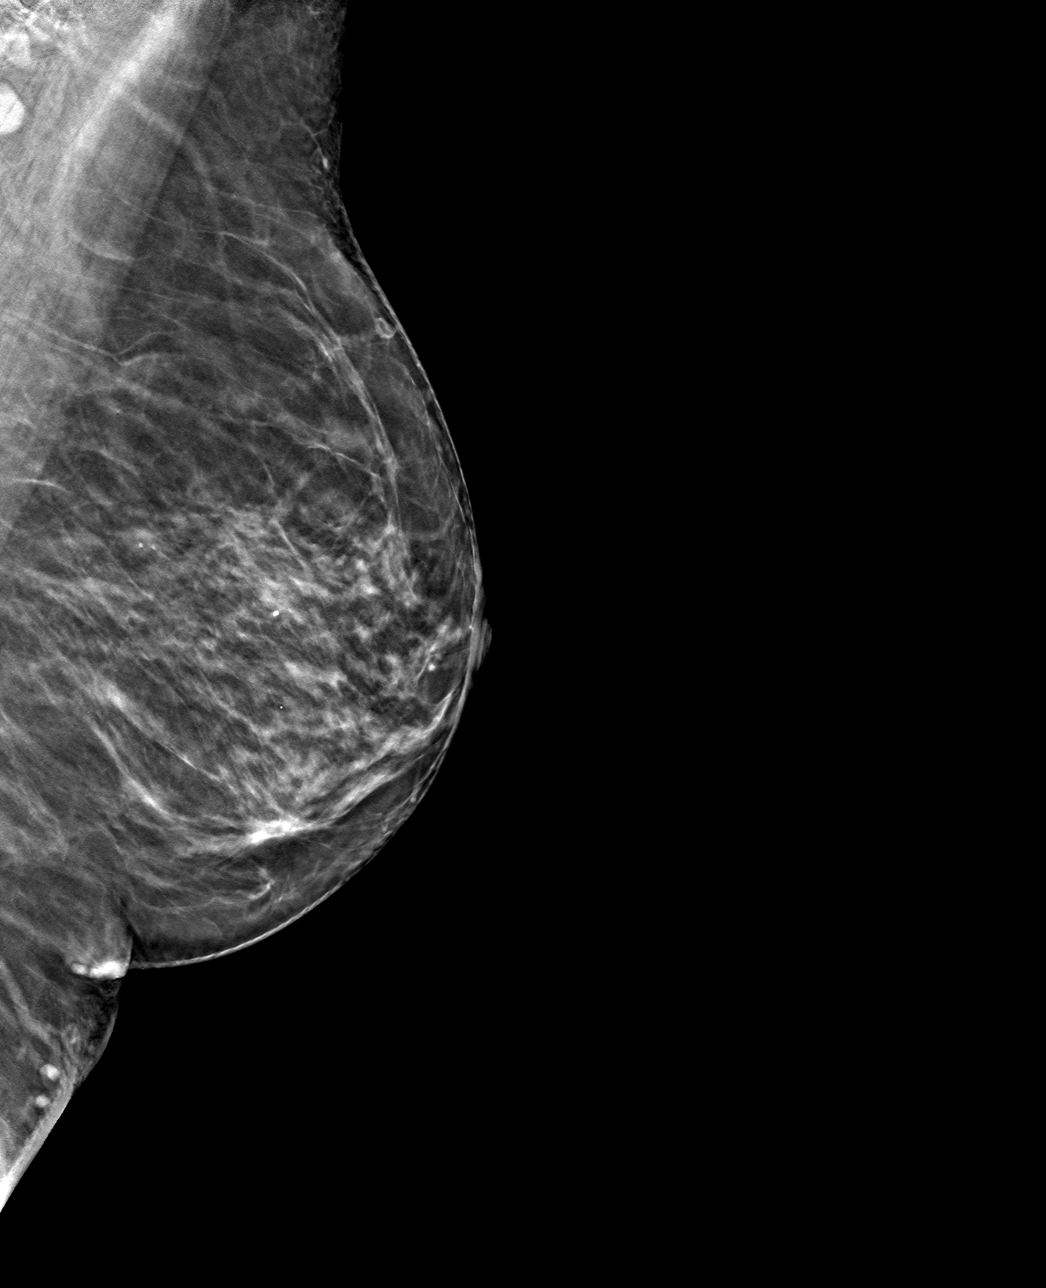

[L CC tomo · tomo slice 29/58.0]
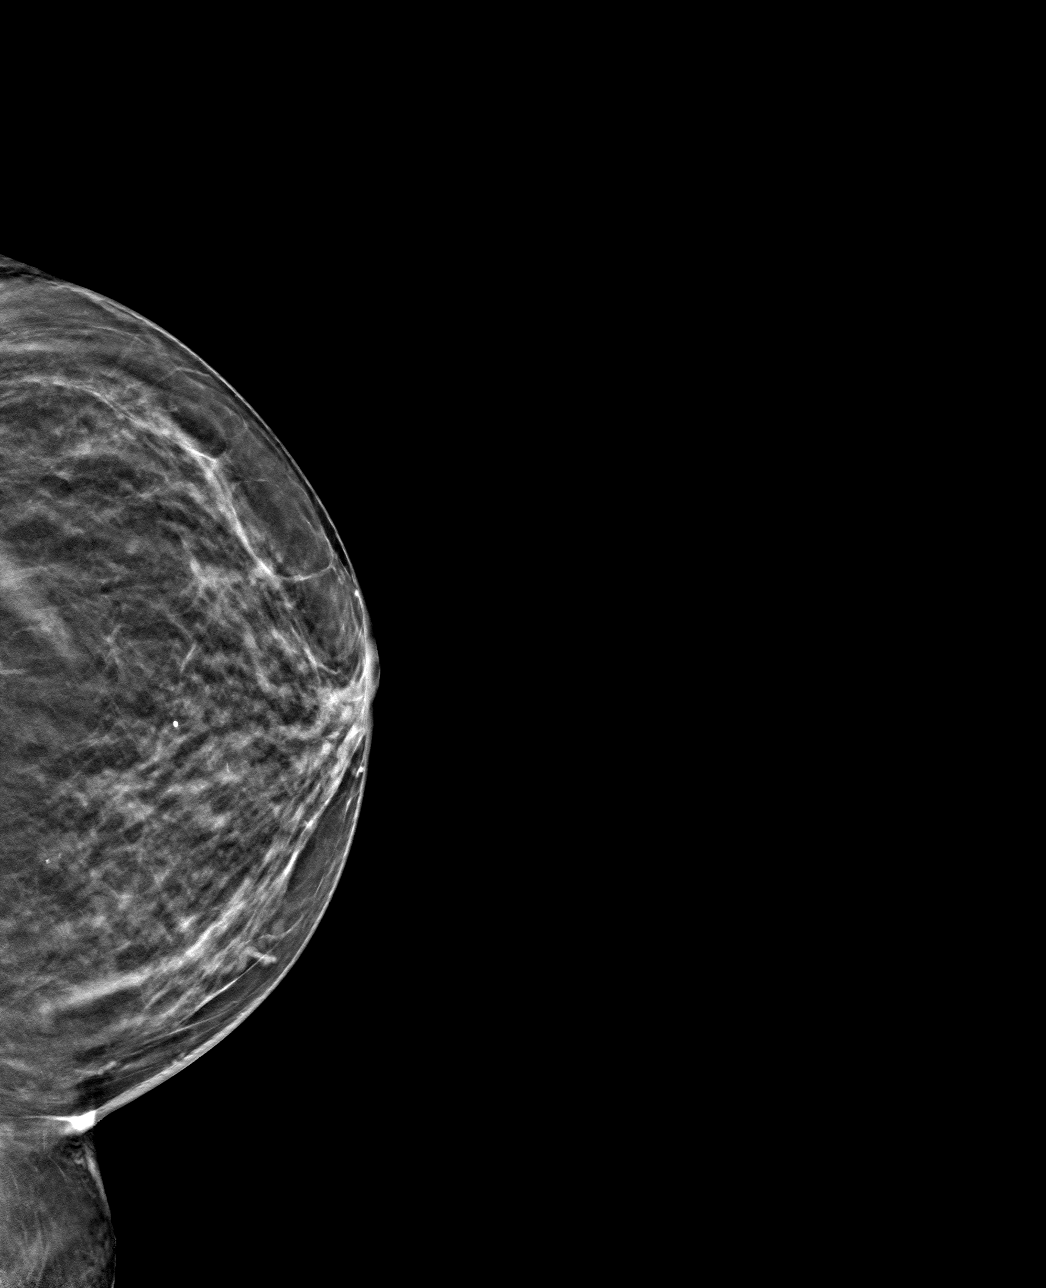

[4 of 12 positions shown; findings below may reference images not displayed]

ACR Breast Density Category c: The breast tissue is heterogeneously
dense, which may obscure small masses.
FINDINGS: There are no findings suspicious for malignancy.
IMPRESSION: No mammographic evidence of malignancy. A result letter of this
screening mammogram will be mailed directly to the patient.

RECOMMENDATION:
Screening mammogram in one year. (Code:R8-2-3E0)

BI-RADS CATEGORY  1: Negative.

## 2022-02-22 ENCOUNTER — Other Ambulatory Visit (HOSPITAL_BASED_OUTPATIENT_CLINIC_OR_DEPARTMENT_OTHER): Payer: Self-pay

## 2022-02-27 ENCOUNTER — Other Ambulatory Visit (HOSPITAL_BASED_OUTPATIENT_CLINIC_OR_DEPARTMENT_OTHER): Payer: Self-pay

## 2022-03-08 ENCOUNTER — Other Ambulatory Visit (HOSPITAL_BASED_OUTPATIENT_CLINIC_OR_DEPARTMENT_OTHER): Payer: Self-pay

## 2022-03-15 DIAGNOSIS — I10 Essential (primary) hypertension: Secondary | ICD-10-CM | POA: Diagnosis not present

## 2022-03-15 DIAGNOSIS — E1169 Type 2 diabetes mellitus with other specified complication: Secondary | ICD-10-CM | POA: Diagnosis not present

## 2022-03-15 DIAGNOSIS — E782 Mixed hyperlipidemia: Secondary | ICD-10-CM | POA: Diagnosis not present

## 2022-03-19 DIAGNOSIS — E1169 Type 2 diabetes mellitus with other specified complication: Secondary | ICD-10-CM | POA: Diagnosis not present

## 2022-03-19 DIAGNOSIS — I10 Essential (primary) hypertension: Secondary | ICD-10-CM | POA: Diagnosis not present

## 2022-03-19 DIAGNOSIS — Z0000 Encounter for general adult medical examination without abnormal findings: Secondary | ICD-10-CM | POA: Diagnosis not present

## 2022-03-19 DIAGNOSIS — E782 Mixed hyperlipidemia: Secondary | ICD-10-CM | POA: Diagnosis not present

## 2022-03-19 DIAGNOSIS — F064 Anxiety disorder due to known physiological condition: Secondary | ICD-10-CM | POA: Diagnosis not present

## 2022-04-08 DIAGNOSIS — Z79899 Other long term (current) drug therapy: Secondary | ICD-10-CM | POA: Diagnosis not present

## 2022-04-08 DIAGNOSIS — K146 Glossodynia: Secondary | ICD-10-CM | POA: Diagnosis not present

## 2022-05-20 ENCOUNTER — Other Ambulatory Visit (HOSPITAL_BASED_OUTPATIENT_CLINIC_OR_DEPARTMENT_OTHER): Payer: Self-pay

## 2022-06-07 ENCOUNTER — Other Ambulatory Visit: Payer: Self-pay | Admitting: Obstetrics & Gynecology

## 2022-06-07 DIAGNOSIS — Z1231 Encounter for screening mammogram for malignant neoplasm of breast: Secondary | ICD-10-CM

## 2022-07-01 ENCOUNTER — Other Ambulatory Visit (HOSPITAL_BASED_OUTPATIENT_CLINIC_OR_DEPARTMENT_OTHER): Payer: Self-pay

## 2022-07-03 ENCOUNTER — Ambulatory Visit
Admission: RE | Admit: 2022-07-03 | Discharge: 2022-07-03 | Disposition: A | Discharge status: Home / Self Care | Payer: Medicare PPO | Source: Ambulatory Visit | Attending: Obstetrics & Gynecology | Admitting: Obstetrics & Gynecology

## 2022-07-03 DIAGNOSIS — Z1231 Encounter for screening mammogram for malignant neoplasm of breast: Secondary | ICD-10-CM | POA: Diagnosis not present

## 2022-07-17 DIAGNOSIS — L658 Other specified nonscarring hair loss: Secondary | ICD-10-CM | POA: Diagnosis not present

## 2022-07-17 DIAGNOSIS — L821 Other seborrheic keratosis: Secondary | ICD-10-CM

## 2022-07-17 DIAGNOSIS — D239 Other benign neoplasm of skin, unspecified: Secondary | ICD-10-CM

## 2022-07-17 DIAGNOSIS — L738 Other specified follicular disorders: Secondary | ICD-10-CM | POA: Diagnosis not present

## 2022-07-17 DIAGNOSIS — Z79899 Other long term (current) drug therapy: Secondary | ICD-10-CM | POA: Diagnosis not present

## 2022-07-17 HISTORY — DX: Other seborrheic keratosis: L82.1

## 2022-07-17 HISTORY — DX: Other benign neoplasm of skin, unspecified: D23.9

## 2022-07-17 HISTORY — DX: Other specified nonscarring hair loss: L65.8

## 2022-07-25 DIAGNOSIS — K146 Glossodynia: Secondary | ICD-10-CM | POA: Diagnosis not present

## 2022-07-25 DIAGNOSIS — I10 Essential (primary) hypertension: Secondary | ICD-10-CM | POA: Diagnosis not present

## 2022-07-25 DIAGNOSIS — F411 Generalized anxiety disorder: Secondary | ICD-10-CM | POA: Diagnosis not present

## 2022-07-25 DIAGNOSIS — E1169 Type 2 diabetes mellitus with other specified complication: Secondary | ICD-10-CM | POA: Diagnosis not present

## 2022-08-14 ENCOUNTER — Other Ambulatory Visit: Payer: Self-pay

## 2022-08-14 DIAGNOSIS — I10 Essential (primary) hypertension: Secondary | ICD-10-CM

## 2022-08-14 MED ORDER — DILTIAZEM HCL ER COATED BEADS 240 MG PO CP24: 240.0000 mg | ORAL_CAPSULE | Freq: Every day | ORAL | 1 refills | Status: DC

## 2022-08-27 DIAGNOSIS — E1169 Type 2 diabetes mellitus with other specified complication: Secondary | ICD-10-CM | POA: Diagnosis not present

## 2022-08-27 DIAGNOSIS — I10 Essential (primary) hypertension: Secondary | ICD-10-CM | POA: Diagnosis not present

## 2022-08-27 DIAGNOSIS — E782 Mixed hyperlipidemia: Secondary | ICD-10-CM | POA: Diagnosis not present

## 2022-08-27 DIAGNOSIS — F064 Anxiety disorder due to known physiological condition: Secondary | ICD-10-CM | POA: Diagnosis not present

## 2022-08-29 DIAGNOSIS — E1169 Type 2 diabetes mellitus with other specified complication: Secondary | ICD-10-CM | POA: Diagnosis not present

## 2022-08-29 DIAGNOSIS — F064 Anxiety disorder due to known physiological condition: Secondary | ICD-10-CM | POA: Diagnosis not present

## 2022-08-29 DIAGNOSIS — I10 Essential (primary) hypertension: Secondary | ICD-10-CM | POA: Diagnosis not present

## 2022-09-01 ENCOUNTER — Other Ambulatory Visit (HOSPITAL_BASED_OUTPATIENT_CLINIC_OR_DEPARTMENT_OTHER): Payer: Self-pay

## 2022-09-01 MED ORDER — SEMAGLUTIDE(0.25 OR 0.5MG/DOS) 2 MG/3ML ~~LOC~~ SOPN
0.5000 mL | PEN_INJECTOR | SUBCUTANEOUS | 1 refills | Status: DC
  Filled 2022-09-01: qty 3, 28d supply, fill #0
  Filled 2022-10-06: qty 3, 28d supply, fill #1

## 2022-09-02 ENCOUNTER — Other Ambulatory Visit (HOSPITAL_BASED_OUTPATIENT_CLINIC_OR_DEPARTMENT_OTHER): Payer: Self-pay

## 2022-10-07 ENCOUNTER — Other Ambulatory Visit: Payer: Self-pay

## 2022-10-07 DIAGNOSIS — K146 Glossodynia: Secondary | ICD-10-CM | POA: Diagnosis not present

## 2022-10-07 DIAGNOSIS — K117 Disturbances of salivary secretion: Secondary | ICD-10-CM | POA: Diagnosis not present

## 2022-10-07 DIAGNOSIS — B370 Candidal stomatitis: Secondary | ICD-10-CM | POA: Diagnosis not present

## 2022-10-09 DIAGNOSIS — M810 Age-related osteoporosis without current pathological fracture: Secondary | ICD-10-CM | POA: Diagnosis not present

## 2022-10-09 DIAGNOSIS — Z780 Asymptomatic menopausal state: Secondary | ICD-10-CM | POA: Diagnosis not present

## 2022-10-09 DIAGNOSIS — M8589 Other specified disorders of bone density and structure, multiple sites: Secondary | ICD-10-CM | POA: Diagnosis not present

## 2022-10-10 ENCOUNTER — Other Ambulatory Visit (HOSPITAL_BASED_OUTPATIENT_CLINIC_OR_DEPARTMENT_OTHER): Payer: Self-pay

## 2022-10-10 DIAGNOSIS — I10 Essential (primary) hypertension: Secondary | ICD-10-CM | POA: Diagnosis not present

## 2022-10-10 DIAGNOSIS — F411 Generalized anxiety disorder: Secondary | ICD-10-CM | POA: Diagnosis not present

## 2022-10-10 DIAGNOSIS — K146 Glossodynia: Secondary | ICD-10-CM | POA: Diagnosis not present

## 2022-10-10 DIAGNOSIS — E1169 Type 2 diabetes mellitus with other specified complication: Secondary | ICD-10-CM | POA: Diagnosis not present

## 2022-11-03 ENCOUNTER — Other Ambulatory Visit (HOSPITAL_BASED_OUTPATIENT_CLINIC_OR_DEPARTMENT_OTHER): Payer: Self-pay

## 2022-11-04 DIAGNOSIS — D2361 Other benign neoplasm of skin of right upper limb, including shoulder: Secondary | ICD-10-CM | POA: Diagnosis not present

## 2022-11-04 DIAGNOSIS — L819 Disorder of pigmentation, unspecified: Secondary | ICD-10-CM | POA: Diagnosis not present

## 2022-11-04 DIAGNOSIS — D2272 Melanocytic nevi of left lower limb, including hip: Secondary | ICD-10-CM | POA: Diagnosis not present

## 2022-11-04 DIAGNOSIS — L821 Other seborrheic keratosis: Secondary | ICD-10-CM | POA: Diagnosis not present

## 2022-11-04 DIAGNOSIS — L720 Epidermal cyst: Secondary | ICD-10-CM | POA: Diagnosis not present

## 2022-11-04 DIAGNOSIS — D2362 Other benign neoplasm of skin of left upper limb, including shoulder: Secondary | ICD-10-CM | POA: Diagnosis not present

## 2022-11-04 MED ORDER — SEMAGLUTIDE(0.25 OR 0.5MG/DOS) 2 MG/3ML ~~LOC~~ SOPN
0.5000 mL | PEN_INJECTOR | SUBCUTANEOUS | 1 refills | Status: DC
  Filled 2022-11-04 – 2023-05-26 (×2): qty 3, 28d supply, fill #0
  Filled 2023-06-22: qty 3, 28d supply, fill #1

## 2022-11-05 ENCOUNTER — Other Ambulatory Visit (HOSPITAL_BASED_OUTPATIENT_CLINIC_OR_DEPARTMENT_OTHER): Payer: Self-pay

## 2022-11-06 ENCOUNTER — Other Ambulatory Visit (HOSPITAL_BASED_OUTPATIENT_CLINIC_OR_DEPARTMENT_OTHER): Payer: Self-pay

## 2022-11-06 DIAGNOSIS — M546 Pain in thoracic spine: Secondary | ICD-10-CM | POA: Diagnosis not present

## 2022-11-06 DIAGNOSIS — M7918 Myalgia, other site: Secondary | ICD-10-CM | POA: Diagnosis not present

## 2022-11-06 DIAGNOSIS — M40294 Other kyphosis, thoracic region: Secondary | ICD-10-CM | POA: Diagnosis not present

## 2022-11-06 MED ORDER — OZEMPIC (0.25 OR 0.5 MG/DOSE) 2 MG/3ML ~~LOC~~ SOPN
0.5000 mg | PEN_INJECTOR | SUBCUTANEOUS | 1 refills | Status: DC
  Filled 2022-11-06: qty 3, 28d supply, fill #0
  Filled 2022-12-15: qty 3, 28d supply, fill #1

## 2022-11-19 ENCOUNTER — Encounter: Payer: Self-pay | Admitting: Emergency Medicine

## 2022-11-19 ENCOUNTER — Emergency Department (HOSPITAL_COMMUNITY)
Admission: EM | Admit: 2022-11-19 | Discharge: 2022-11-19 | Disposition: A | Discharge status: Home / Self Care | Payer: Medicare PPO | Attending: Emergency Medicine | Admitting: Emergency Medicine

## 2022-11-19 ENCOUNTER — Encounter (HOSPITAL_COMMUNITY): Payer: Self-pay

## 2022-11-19 ENCOUNTER — Ambulatory Visit
Admission: EM | Admit: 2022-11-19 | Discharge: 2022-11-19 | Payer: Medicare PPO | Attending: Physician Assistant | Admitting: Physician Assistant

## 2022-11-19 ENCOUNTER — Emergency Department (HOSPITAL_COMMUNITY): Payer: Medicare PPO

## 2022-11-19 ENCOUNTER — Other Ambulatory Visit: Payer: Self-pay

## 2022-11-19 DIAGNOSIS — J45909 Unspecified asthma, uncomplicated: Secondary | ICD-10-CM | POA: Diagnosis not present

## 2022-11-19 DIAGNOSIS — Z7951 Long term (current) use of inhaled steroids: Secondary | ICD-10-CM | POA: Diagnosis not present

## 2022-11-19 DIAGNOSIS — R9431 Abnormal electrocardiogram [ECG] [EKG]: Secondary | ICD-10-CM | POA: Insufficient documentation

## 2022-11-19 DIAGNOSIS — Z794 Long term (current) use of insulin: Secondary | ICD-10-CM | POA: Diagnosis not present

## 2022-11-19 DIAGNOSIS — R000 Tachycardia, unspecified: Secondary | ICD-10-CM

## 2022-11-19 DIAGNOSIS — R059 Cough, unspecified: Secondary | ICD-10-CM | POA: Diagnosis not present

## 2022-11-19 DIAGNOSIS — Z7984 Long term (current) use of oral hypoglycemic drugs: Secondary | ICD-10-CM | POA: Diagnosis not present

## 2022-11-19 DIAGNOSIS — Z1152 Encounter for screening for COVID-19: Secondary | ICD-10-CM | POA: Diagnosis not present

## 2022-11-19 DIAGNOSIS — R051 Acute cough: Secondary | ICD-10-CM

## 2022-11-19 DIAGNOSIS — R0602 Shortness of breath: Secondary | ICD-10-CM

## 2022-11-19 DIAGNOSIS — Z79899 Other long term (current) drug therapy: Secondary | ICD-10-CM | POA: Diagnosis not present

## 2022-11-19 DIAGNOSIS — R079 Chest pain, unspecified: Secondary | ICD-10-CM | POA: Diagnosis not present

## 2022-11-19 DIAGNOSIS — J101 Influenza due to other identified influenza virus with other respiratory manifestations: Secondary | ICD-10-CM | POA: Diagnosis not present

## 2022-11-19 LAB — BASIC METABOLIC PANEL
Anion gap: 13 (ref 5–15)
BUN: 16 mg/dL (ref 8–23)
CO2: 23 mmol/L (ref 22–32)
Calcium: 9.6 mg/dL (ref 8.9–10.3)
Chloride: 100 mmol/L (ref 98–111)
Creatinine, Ser: 1.02 mg/dL — ABNORMAL HIGH (ref 0.44–1.00)
GFR, Estimated: 58 mL/min — ABNORMAL LOW (ref >60)
Glucose, Bld: 159 mg/dL — ABNORMAL HIGH (ref 70–99)
Potassium: 3.4 mmol/L — ABNORMAL LOW (ref 3.5–5.1)
Sodium: 136 mmol/L (ref 135–145)

## 2022-11-19 LAB — CBC
HCT: 36.4 % (ref 36.0–46.0)
Hemoglobin: 12 g/dL (ref 12.0–15.0)
MCH: 30.8 pg (ref 26.0–34.0)
MCHC: 33 g/dL (ref 30.0–36.0)
MCV: 93.3 fL (ref 80.0–100.0)
Platelets: 194 10*3/uL (ref 150–400)
RBC: 3.9 MIL/uL (ref 3.87–5.11)
RDW: 14.7 % (ref 11.5–15.5)
WBC: 5.6 10*3/uL (ref 4.0–10.5)
nRBC: 0 % (ref 0.0–0.2)

## 2022-11-19 LAB — TROPONIN I (HIGH SENSITIVITY)
Troponin I (High Sensitivity): 13 ng/L (ref <18)
Troponin I (High Sensitivity): 14 ng/L (ref <18)

## 2022-11-19 LAB — RESP PANEL BY RT-PCR (RSV, FLU A&B, COVID)  RVPGX2
Influenza A by PCR: POSITIVE — AB
Influenza B by PCR: NEGATIVE
Resp Syncytial Virus by PCR: NEGATIVE
SARS Coronavirus 2 by RT PCR: NEGATIVE

## 2022-11-19 LAB — POCT INFLUENZA A/B
Influenza A, POC: NEGATIVE
Influenza B, POC: NEGATIVE

## 2022-11-19 LAB — HEPATIC FUNCTION PANEL
ALT: 42 U/L (ref 0–44)
AST: 56 U/L — ABNORMAL HIGH (ref 15–41)
Albumin: 3.8 g/dL (ref 3.5–5.0)
Alkaline Phosphatase: 34 U/L — ABNORMAL LOW (ref 38–126)
Bilirubin, Direct: 0.3 mg/dL — ABNORMAL HIGH (ref 0.0–0.2)
Indirect Bilirubin: 0.5 mg/dL (ref 0.3–0.9)
Total Bilirubin: 0.8 mg/dL (ref 0.3–1.2)
Total Protein: 7.9 g/dL (ref 6.5–8.1)

## 2022-11-19 LAB — SARS CORONAVIRUS 2 BY RT PCR: SARS Coronavirus 2 by RT PCR: NEGATIVE

## 2022-11-19 LAB — LACTIC ACID, PLASMA: Lactic Acid, Venous: 2.4 mmol/L (ref 0.5–1.9)

## 2022-11-19 MED ORDER — OSELTAMIVIR PHOSPHATE 75 MG PO CAPS: 75.0000 mg | ORAL_CAPSULE | Freq: Two times a day (BID) | ORAL | 0 refills | Status: DC

## 2022-11-19 MED ORDER — OSELTAMIVIR PHOSPHATE 75 MG PO CAPS
75.0000 mg | ORAL_CAPSULE | Freq: Once | ORAL | Status: AC
  Administered 2022-11-19: 75 mg via ORAL
  Filled 2022-11-19: qty 1

## 2022-11-19 MED ORDER — HYDROCODONE BIT-HOMATROP MBR 5-1.5 MG/5ML PO SOLN
5.0000 mL | Freq: Once | ORAL | Status: AC
  Administered 2022-11-19: 5 mL via ORAL
  Filled 2022-11-19: qty 5

## 2022-11-19 MED ORDER — IOHEXOL 350 MG/ML SOLN
75.0000 mL | Freq: Once | INTRAVENOUS | Status: AC | PRN
  Administered 2022-11-19: 75 mL via INTRAVENOUS

## 2022-11-19 MED ORDER — DILTIAZEM HCL 25 MG/5ML IV SOLN
15.0000 mg | Freq: Once | INTRAVENOUS | Status: AC
  Administered 2022-11-19: 15 mg via INTRAVENOUS
  Filled 2022-11-19: qty 5

## 2022-11-19 MED ORDER — HYDROCODONE BIT-HOMATROP MBR 5-1.5 MG/5ML PO SOLN: 5.0000 mL | ORAL | 0 refills | Status: DC | PRN

## 2022-11-19 MED ORDER — SODIUM CHLORIDE 0.9 % IV BOLUS
1000.0000 mL | Freq: Once | INTRAVENOUS | Status: AC
  Administered 2022-11-19: 1000 mL via INTRAVENOUS

## 2022-11-19 MED ORDER — BENZONATATE 100 MG PO CAPS: 100.0000 mg | ORAL_CAPSULE | Freq: Three times a day (TID) | ORAL | 0 refills | Status: DC

## 2022-11-19 MED ORDER — ACETAMINOPHEN 500 MG PO TABS
1000.0000 mg | ORAL_TABLET | Freq: Once | ORAL | Status: AC
  Administered 2022-11-19: 1000 mg via ORAL
  Filled 2022-11-19: qty 2

## 2022-11-19 MED ORDER — IPRATROPIUM-ALBUTEROL 0.5-2.5 (3) MG/3ML IN SOLN
3.0000 mL | Freq: Once | RESPIRATORY_TRACT | Status: AC
  Administered 2022-11-19: 3 mL via RESPIRATORY_TRACT
  Filled 2022-11-19: qty 3

## 2022-11-19 NOTE — ED Notes (Signed)
Pt given sandwich bag and cup of water 

## 2022-11-19 NOTE — ED Provider Triage Note (Deleted)
Emergency Medicine Provider Triage Evaluation Note  Mary Choi , a 72 y.o. female  was evaluated in triage.  Patient has swelling of the right lower extremity.  Leg is weeping lower extremity.  No dopplerable DP pulse.  Patient does not mention this however she was at urgent care an hour ago and was sent here due to a heart rate in the 140s and ST changes.  She has a heart rate of 137 and a borderline temperature of 100.2.  Will go ahead and order sepsis labs in the event that she is septic from her foot infection  Review of Systems  Positive:  Negative:  Physical Exam  BP (!) 165/104 (BP Location: Right Arm)   Pulse (!) 137   Temp 100.2 F (37.9 C) (Oral)   Resp 16   SpO2 98%  Gen:   Awake, no distress   Resp:  Normal effort  MSK:   Moves extremities without difficulty  Other:  Unable to Doppler DP pulse.  Weeping extremity  Medical Decision Making  Medically screening exam initiated at 11:41 AM.  Appropriate orders placed.  Mary Choi was informed that the remainder of the evaluation will be completed by another provider, this initial triage assessment does not replace that evaluation, and the importance of remaining in the ED until their evaluation is complete.     [name], PA-C 11/19/22 1142

## 2022-11-19 NOTE — ED Provider Notes (Signed)
[place] EMERGENCY DEPARTMENT AT [facility] Provider Note   CSN: [id number] Arrival date & time: 11/19/22  1109     History  Chief Complaint  Patient presents with   Cough   Shortness of Breath    Mary Choi is a 72 y.o. female.  HPI Patient was seen in urgent care and referred to the emergency department.  Patient had very acute onset of coughing.  She reports that she felt short of breath.  Patient had a recent travel history to [place].  She had flown there and back.  Patient does have a history of asthma but had been walking a lot and did not feel that she needed her albuterol inhaler.  She reports that she chronically has some swelling in the left leg.  She did not feel that it was worse than baseline.  Patient was significantly tachycardic at urgent care with rates up to 140s and ST depression.  She was referred to the emergency department for further evaluation.    Home Medications Prior to Admission medications   Medication Sig Start Date End Date Taking? Authorizing Provider  benzonatate (TESSALON) 100 MG capsule Take 1 capsule (100 mg total) by mouth every 8 (eight) hours. 11/19/22  Yes Haywood Meinders, Lebron Conners, MD  HYDROcodone bit-homatropine (HYCODAN) 5-1.5 MG/5ML syrup Take 5 mLs by mouth every 4 (four) hours as needed for cough. 11/19/22  Yes Arby Barrette, MD  oseltamivir (TAMIFLU) 75 MG capsule Take 1 capsule (75 mg total) by mouth every 12 (twelve) hours. 11/19/22  Yes Arby Barrette, MD  acetaminophen (TYLENOL) 500 MG tablet Take 1,000 mg by mouth every 6 (six) hours as needed for mild pain.    [provider]  albuterol (PROVENTIL HFA;VENTOLIN HFA) 108 (90 BASE) MCG/ACT inhaler Inhale 2 puffs into the lungs every 6 (six) hours as needed for wheezing.    [provider]  baclofen (LIORESAL) 10 MG tablet Take 10 mg by mouth 4 (four) times daily.    [provider]  cholecalciferol (VITAMIN D3) 25 MCG (1000 UNIT) tablet Take 1,000  Units by mouth daily.    [provider]  diltiazem (CARDIZEM CD) 240 MG 24 hr capsule Take 1 capsule (240 mg total) by mouth daily. 08/14/22 08/09/23  Yates Decamp, MD  fluconazole (DIFLUCAN) 200 MG tablet Take 200 mg by mouth daily. 08/28/21   [provider]  insulin glargine, 2 Unit Dial, (TOUJEO MAX SOLOSTAR) 300 UNIT/ML Solostar Pen Inject 10 Units into the skin at bedtime.    [provider]  losartan-hydrochlorothiazide (HYZAAR) 100-25 MG per tablet Take 1 tablet by mouth daily.    [provider]  metFORMIN (GLUCOPHAGE) 1000 MG tablet Take 1,000 mg by mouth at bedtime.     [provider]  OZEMPIC, 0.25 OR 0.5 MG/DOSE, 2 MG/3ML SOPN Inject 0.5 mg into the skin once a week. 11/06/22     pioglitazone (ACTOS) 15 MG tablet Take 15 mg by mouth every other day.     [provider]  Potassium Chloride Crys ER (KLOR-CON M20 PO) Take 1 tablet by mouth daily at 12 noon.    [provider]  rosuvastatin (CRESTOR) 10 MG tablet Take 10 mg by mouth daily.    [provider]  Semaglutide,0.25 or 0.5MG /DOS, (OZEMPIC, 0.25 OR 0.5 MG/DOSE,) 2 MG/3ML SOPN Inject 0.5 mLs into the skin once a week. 11/04/22         Allergies    Patient has no known allergies.  Review of Systems   Review of Systems  Physical Exam Updated Vital Signs BP (!) 148/94   Pulse (!) 114   Temp 98.8 F (37.1 C) (Oral)   Resp 20   SpO2 96%  Physical Exam Constitutional:      Comments: Alert nontoxic.  No respiratory distress at rest.  Well-nourished well-developed.  HENT:     Mouth/Throat:     Pharynx: Oropharynx is clear.  Eyes:     Extraocular Movements: Extraocular movements intact.  Cardiovascular:     Comments: Significant tachycardia.  Cannot appreciate murmur or gallop. Pulmonary:     Effort: Pulmonary effort is normal.     Breath sounds: Normal breath sounds.  Abdominal:     General: There is no distension.     Palpations: Abdomen is soft.      Tenderness: There is no abdominal tenderness. There is no guarding.  Musculoskeletal:     Comments: No calf tenderness.  Skin:    General: Skin is warm and dry.  Neurological:     General: No focal deficit present.     Mental Status: She is oriented to person, place, and time.     Coordination: Coordination normal.  Psychiatric:        Mood and Affect: Mood normal.     ED Results / Procedures / Treatments   Labs (all labs ordered are listed, but only abnormal results are displayed) Labs Reviewed  RESP PANEL BY RT-PCR (RSV, FLU A&B, COVID)  RVPGX2 - Abnormal; Notable for the following components:      Result Value   Influenza A by PCR POSITIVE (*)    All other components within normal limits  BASIC METABOLIC PANEL - Abnormal; Notable for the following components:   Potassium 3.4 (*)    Glucose, Bld 159 (*)    Creatinine, Ser 1.02 (*)    GFR, Estimated 58 (*)    All other components within normal limits  LACTIC ACID, PLASMA - Abnormal; Notable for the following components:   Lactic Acid, Venous 2.4 (*)    All other components within normal limits  HEPATIC FUNCTION PANEL - Abnormal; Notable for the following components:   AST 56 (*)    Alkaline Phosphatase 34 (*)    Bilirubin, Direct 0.3 (*)    All other components within normal limits  SARS CORONAVIRUS 2 BY RT PCR  CULTURE, BLOOD (ROUTINE X 2)  CULTURE, BLOOD (ROUTINE X 2)  CBC  LACTIC ACID, PLASMA  BRAIN NATRIURETIC PEPTIDE  TROPONIN I (HIGH SENSITIVITY)  TROPONIN I (HIGH SENSITIVITY)    EKG EKG Interpretation  Date/Time:  Tuesday November 19 2022 13:27:25 EDT Ventricular Rate:  137 PR Interval:  140 QRS Duration: 114 QT Interval:  284 QTC Calculation: 428 R Axis:   34 Text Interpretation: Sinus tachycardia Left ventricular hypertrophy with repolarization abnormality ( Cornell product ) Cannot rule out Septal infarct , age undetermined Abnormal ECG When compared with ECG of 19-Nov-2022 08:49, PREVIOUS ECG IS  PRESENT No significant change since last tracing Confirmed by Jacalyn Lefevre [phone]) on 11/19/2022 3:04:14 PM  Radiology CT Angio Chest PE W/Cm &/Or Wo Cm  Result Date: 11/19/2022 CLINICAL DATA:  Chest pain, shortness of breath. EXAM: CT ANGIOGRAPHY CHEST WITH CONTRAST TECHNIQUE: Multidetector CT imaging of the chest was performed using the standard protocol during bolus administration of intravenous contrast. Multiplanar CT image reconstructions and MIPs were obtained to evaluate the vascular anatomy. RADIATION DOSE REDUCTION: This exam was performed according to the departmental dose-optimization  program which includes automated exposure control, adjustment of the mA and/or kV according to patient size and/or use of iterative reconstruction technique. CONTRAST:  75mL OMNIPAQUE IOHEXOL 350 MG/ML SOLN COMPARISON:  January 01, 2022. FINDINGS: Cardiovascular: Satisfactory opacification of the pulmonary arteries to the segmental level. No evidence of pulmonary embolism. Normal heart size. No pericardial effusion. Mediastinum/Nodes: No enlarged mediastinal, hilar, or axillary lymph nodes. Thyroid gland, trachea, and esophagus demonstrate no significant findings. Lungs/Pleura: Lungs are clear. No pleural effusion or pneumothorax. Upper Abdomen: No acute abnormality. Musculoskeletal: Stable old T4 compression fracture. No acute osseous abnormality is noted. Review of the MIP images confirms the above findings. IMPRESSION: No definite evidence of pulmonary embolus. No acute abnormality seen. Electronically Signed   By: Lupita Raider M.D.   On: 11/19/2022 17:58   DG Chest 2 View  Result Date: 11/19/2022 CLINICAL DATA:  Chest pain.  Shortness of breath.  Cough EXAM: CHEST - 2 VIEW COMPARISON:  X-ray 01/01/2022 and CT angiogram FINDINGS: Hyperinflation. No consolidation, pneumothorax or effusion. No edema. Normal cardiopericardial silhouette. Calcified aorta. Surgical clips along the lower chest and upper abdomen as  well as the right axillary region. Osteopenia. Stable compression of T4. IMPRESSION: Hyperinflation.  No acute cardiopulmonary disease Electronically Signed   By: Karen Kays M.D.   On: 11/19/2022 12:57    Procedures Procedures    Medications Ordered in ED Medications  acetaminophen (TYLENOL) tablet 1,000 mg (1,000 mg Oral Given 11/19/22 1615)  sodium chloride 0.9 % bolus 1,000 mL (0 mLs Intravenous Stopped 11/19/22 1856)  iohexol (OMNIPAQUE) 350 MG/ML injection 75 mL (75 mLs Intravenous Contrast Given 11/19/22 1744)  sodium chloride 0.9 % bolus 1,000 mL (1,000 mLs Intravenous New Bag/Given 11/19/22 1916)  diltiazem (CARDIZEM) injection 15 mg (15 mg Intravenous Given 11/19/22 1916)  oseltamivir (TAMIFLU) capsule 75 mg (75 mg Oral Given 11/19/22 1931)  HYDROcodone bit-homatropine (HYCODAN) 5-1.5 MG/5ML syrup 5 mL (5 mLs Oral Given 11/19/22 2027)  ipratropium-albuterol (DUONEB) 0.5-2.5 (3) MG/3ML nebulizer solution 3 mL (3 mLs Nebulization Given 11/19/22 2027)    ED Course/ Medical Decision Making/ A&P                             Medical Decision Making Amount and/or Complexity of Data Reviewed Labs: ordered. Radiology: ordered.  Risk OTC drugs. Prescription drug management.   Patient presents as outlined.  She does have low-grade fever in the emergency department and cough with shortness of breath.  Patient had recent travel by airplane.  Heart rate significantly elevated to 140s.  EKG is showing ST segment depression.  Patient does not have active chest pain.  Differential diagnosis includes ACS\PE\pneumonia or infectious illness with rate related EKG changes.  Will initiate diagnostic evaluation for rule out PE\ACS and start fluid resuscitation for tachycardia and fever with possible infection.   Troponin is normal.  Lactic acid 2.4.  White count 5.6.  Metabolic panel shows slight increase in blood sugar at 159 BUN and creatinine 16 and 1.02.  CT PE study interpreted by radiology no  acute findings.  No consolidations or evidence of PE.  Patient was given volume resuscitation with normal saline and fever treated with extra strength Tylenol.  Heart rate did improve into the 120s.  An additional liter of normal saline given.  Patient is also due for her evening Cardizem.  She was given 15 mg IV of Cardizem.  Heart rate and blood pressure improved.  Heart rate  down to the low 100s.  This point EKG repeated.  Rate much improved.  Patient does continue to have some ST depression diffusely.  Will consult cardiology.  Cardiology consultation, Dr. Park Breed.  We reviewed the EKG and patient's history of present illness.  At this time with diagnostic evaluation and findings, low suspicion for ACS as etiology of changes.  Dr. Park Breed suspects is rate related and at this time does not need urgent or emergent cardiac evaluation but recommends outpatient echocardiogram follow-up.  Patient reassessed and much improved after fluid resuscitation and medications.  She was given Hycodan syrup and Tamiflu.  At this time heart rate is in the low 100s and patient does not feel short of breath or have chest pain.  We reviewed the plan of expeditious follow-up on outpatient basis for follow-up echocardiogram and strict return precautions.  Patient voices understanding.          Final Clinical Impression(s) / ED Diagnoses Final diagnoses:  Acute cough  Shortness of breath  Tachycardia  EKG abnormalities  Influenza A    Rx / DC Orders ED Discharge Orders          Ordered    oseltamivir (TAMIFLU) 75 MG capsule  Every 12 hours        11/19/22 2205    HYDROcodone bit-homatropine (HYCODAN) 5-1.5 MG/5ML syrup  Every 4 hours PRN        11/19/22 2205    benzonatate (TESSALON) 100 MG capsule  Every 8 hours        11/19/22 2205              Arby Barrette, MD 11/19/22 2227

## 2022-11-19 NOTE — ED Provider Notes (Signed)
[facility]    CSN: [id number] Arrival date & time: 11/19/22  4098      History   Chief Complaint Chief Complaint  Patient presents with   Cough    HPI Mary Choi is a 72 y.o. female.   Patient presents today with a 2-day history of URI symptoms including cough, shortness of breath, headache.  Reports that she has been coughing so much that is given her headache but she denies any associated chest pain, weakness, nausea, vomiting, diarrhea.  Denies any known sick contacts but does report that she recently traveled to [place] for her birthday.  She does have a history of asthma but has been using her albuterol inhaler without improvement of symptoms.  She has had COVID several years ago.  She is up-to-date on COVID and influenza vaccines.  She is also had RSV vaccine.  She does not smoke.  Does have a history of diabetes but reports her blood sugars are adequately controlled; fasting blood sugar this morning was approximately 140 but generally this has been less than 120.  She denies any recent antibiotics or steroids.  She is having difficulty with her daily activities as result of symptoms.    Past Medical History:  Diagnosis Date   Allergy    Anemia    Asthma    Breast cancer 1996   Cancer    Diabetes mellitus without complication    Hyperlipidemia    Hypertension     Patient Active Problem List   Diagnosis Date Noted   Dislocation 01/26/2015   Ankle fracture 01/25/2015   HTN (hypertension) 01/25/2015   HLD (hyperlipidemia) 01/25/2015   Asthma 01/25/2015   Diabetes 01/07/2013    Past Surgical History:  Procedure Laterality Date   ABDOMINAL HYSTERECTOMY     BREAST SURGERY     CESAREAN SECTION     MASTECTOMY Right    ORIF ANKLE FRACTURE Right 01/31/2015   Procedure: OPEN REDUCTION INTERNAL FIXATION (ORIF) ANKLE FRACTURE;  Surgeon: Sheral Apley, MD;  Location: [facility];  Service: Orthopedics;  Laterality: Right;    OB History    No obstetric history on file.      Home Medications    Prior to Admission medications   Medication Sig Start Date End Date Taking? Authorizing Provider  acetaminophen (TYLENOL) 500 MG tablet Take 1,000 mg by mouth every 6 (six) hours as needed for mild pain.    [provider]  albuterol (PROVENTIL HFA;VENTOLIN HFA) 108 (90 BASE) MCG/ACT inhaler Inhale 2 puffs into the lungs every 6 (six) hours as needed for wheezing.    [provider]  baclofen (LIORESAL) 10 MG tablet Take 10 mg by mouth 4 (four) times daily.    [provider]  cholecalciferol (VITAMIN D3) 25 MCG (1000 UNIT) tablet Take 1,000 Units by mouth daily.    [provider]  diltiazem (CARDIZEM CD) 240 MG 24 hr capsule Take 1 capsule (240 mg total) by mouth daily. 08/14/22 08/09/23  Yates Decamp, MD  fluconazole (DIFLUCAN) 200 MG tablet Take 200 mg by mouth daily. 08/28/21   [provider]  insulin glargine, 2 Unit Dial, (TOUJEO MAX SOLOSTAR) 300 UNIT/ML Solostar Pen Inject 10 Units into the skin at bedtime.    [provider]  losartan-hydrochlorothiazide (HYZAAR) 100-25 MG per tablet Take 1 tablet by mouth daily.    [provider]  metFORMIN (GLUCOPHAGE) 1000 MG tablet Take 1,000 mg by mouth at bedtime.  [provider]  OZEMPIC, 0.25 OR 0.5 MG/DOSE, 2 MG/3ML SOPN Inject 0.5 mg into the skin once a week. 11/06/22     pioglitazone (ACTOS) 15 MG tablet Take 15 mg by mouth every other day.     [provider]  Potassium Chloride Crys ER (KLOR-CON M20 PO) Take 1 tablet by mouth daily at 12 noon.    [provider]  rosuvastatin (CRESTOR) 10 MG tablet Take 10 mg by mouth daily.    [provider]  Semaglutide,0.25 or 0.5MG /DOS, (OZEMPIC, 0.25 OR 0.5 MG/DOSE,) 2 MG/3ML SOPN Inject 0.5 mLs into the skin once a week. 11/04/22       Family History Family History  Problem Relation Age of Onset   Diabetes Mother    Hypertension Mother     Heart disease Brother    Pancreatic cancer Maternal Grandfather    Colon cancer Neg Hx    Esophageal cancer Neg Hx    Liver cancer Neg Hx    Rectal cancer Neg Hx    Stomach cancer Neg Hx     Social History Social History   Tobacco Use   Smoking status: Never   Smokeless tobacco: Never  Vaping Use   Vaping Use: Never used  Substance Use Topics   Alcohol use: Yes    Comment: social    Drug use: No     Allergies   Patient has no known allergies.   Review of Systems Review of Systems  Constitutional:  Positive for activity change. Negative for appetite change, fatigue and fever.  HENT:  Positive for congestion and sore throat. Negative for sinus pressure and sneezing.   Respiratory:  Positive for cough and shortness of breath.   Cardiovascular:  Negative for chest pain.  Gastrointestinal:  Negative for abdominal pain, diarrhea, nausea and vomiting.  Neurological:  Positive for headaches. Negative for dizziness and light-headedness.     Physical Exam Triage Vital Signs ED Triage Vitals [11/19/22 0830]  Enc Vitals Group     BP (!) 130/91     Pulse Rate (!) 112     Resp 18     Temp 99.2 F (37.3 C)     Temp Source Oral     SpO2 95 %     Weight      Height      Head Circumference      Peak Flow      Pain Score 4     Pain Loc      Pain Edu?      Excl. in GC?    No data found.  Updated Vital Signs BP (!) 130/91 (BP Location: Left Arm)   Pulse (!) 112   Temp 99.2 F (37.3 C) (Oral)   Resp 18   SpO2 95%   Visual Acuity Right Eye Distance:   Left Eye Distance:   Bilateral Distance:    Right Eye Near:   Left Eye Near:    Bilateral Near:     Physical Exam Vitals reviewed.  Constitutional:      General: She is awake. She is not in acute distress.    Appearance: Normal appearance. She is well-developed. She is not ill-appearing.     Comments: Very pleasant female presented age in no acute distress sitting comfortably in exam room  HENT:     Head:  Normocephalic and atraumatic.     Right Ear: Tympanic membrane, ear canal and external ear normal. Tympanic membrane is not erythematous or bulging.  Left Ear: Tympanic membrane, ear canal and external ear normal. Tympanic membrane is not erythematous or bulging.     Nose:     Right Sinus: No maxillary sinus tenderness or frontal sinus tenderness.     Left Sinus: No maxillary sinus tenderness or frontal sinus tenderness.     Mouth/Throat:     Mouth: Mucous membranes are dry.     Pharynx: Uvula midline. No oropharyngeal exudate or posterior oropharyngeal erythema.     Comments: Uvula surgically absent. Cardiovascular:     Rate and Rhythm: Regular rhythm. Tachycardia present.     Heart sounds: Normal heart sounds, S1 normal and S2 normal. No murmur heard. Pulmonary:     Effort: Pulmonary effort is normal.     Breath sounds: Normal breath sounds. No wheezing, rhonchi or rales.     Comments: Clear to auscultation bilaterally; reactive cough with deep breathing Psychiatric:        Behavior: Behavior is cooperative.      UC Treatments / Results  Labs (all labs ordered are listed, but only abnormal results are displayed) Labs Reviewed  POCT INFLUENZA A/B    EKG   Radiology No results found.  Procedures Procedures (including critical care time)  Medications Ordered in UC Medications - No data to display  Initial Impression / Assessment and Plan / UC Course  I have reviewed the triage vital signs and the nursing notes.  Pertinent labs & imaging results that were available during my care of the patient were reviewed by me and considered in my medical decision making (see chart for details).     Patient was noted to be tachycardic on exam.  EKG was obtained that showed sinus tachycardia with ventricular rate of 146 bpm with widespread ST depression in inferior and lateral leads.  Previous EKG from 01/01/2022 showed sinus tachycardia with ventricular rate of 102 bpm but without  ST abnormalities.  We discussed that given her high heart rate and abnormal EKG I recommend she go to the emergency room for further evaluation and management.  She is agreeable.  Offered transport by EMS but patient declined this and will have her sister-in-law take her directly to the emergency room for further evaluation and management.  She was stable at the time of discharge.  Final Clinical Impressions(s) / UC Diagnoses   Final diagnoses:  Tachycardia  ST segment depression  Abnormal EKG  Acute cough     Discharge Instructions      Go to the emergency room for further evaluation and management as we discussed given your high heart rate and abnormal EKG.     ED Prescriptions   None    PDMP not reviewed this encounter.   Jeani Hawking, PA-C 11/19/22 [id number]

## 2022-11-19 NOTE — ED Triage Notes (Signed)
Pt arrives for eval of cough. Denies hx of PE or smoking. Occasionally is productive but mostly dry.  

## 2022-11-19 NOTE — ED Notes (Signed)
RN TORI at bedside, Tachy Alarm [phone]

## 2022-11-19 NOTE — ED Triage Notes (Signed)
Pt sts cough and body aches x 2 days  

## 2022-11-19 NOTE — Discharge Instructions (Signed)
Go to the emergency room for further evaluation and management as we discussed given your high heart rate and abnormal EKG.

## 2022-11-19 NOTE — ED Triage Notes (Addendum)
Patient complains of weeping to right lower leg for the past few days. Denies trauma, denies pain. No hx of same. Had dialysis yesterday as scheduled. No pulse with doppler

## 2022-11-19 NOTE — ED Notes (Signed)
Patient is being discharged from the Urgent Care and sent to the Emergency Department via personal vehicle . Per Provider Dorann Ou, patient is in need of higher level of care due to tachycardia & abnormal EKG. Patient is aware and verbalizes understanding of plan of care.   Vitals:   11/19/22 0830  BP: (!) 130/91  Pulse: (!) 112  Resp: 18  Temp: 99.2 F (37.3 C)  SpO2: 95%

## 2022-11-19 NOTE — ED Provider Triage Note (Signed)
Emergency Medicine Provider Triage Evaluation Note  Mary Choi , a 72 y.o. female  was evaluated in triage.  Pt complains of sob, cough. Sent from UC with tachycardia and PNA  Review of Systems  Positive:  Negative:   Physical Exam  BP (!) 165/104 (BP Location: Right Arm)   Pulse (!) 137   Temp 100.2 F (37.9 C) (Oral)   Resp 16   SpO2 98%  Gen:   Awake, no distress   Resp:  Normal effort  MSK:   Moves extremities without difficulty  Other:    Medical Decision Making  Medically screening exam initiated at 11:59 AM.  Appropriate orders placed.  Mary Choi was informed that the remainder of the evaluation will be completed by another provider, this initial triage assessment does not replace that evaluation, and the importance of remaining in the ED until their evaluation is complete.     Mary Benders, PA-C 11/19/22 1201

## 2022-11-19 NOTE — Discharge Instructions (Addendum)
1.  You tested positive for influenza.  Your CT scan did not show evidence of a blood clot or pneumonia. 2.  Your EKG had some abnormalities on it.  This is likely due to elevated heart rate from being sick.  It is however very important that you get a close follow-up with your doctor to schedule outpatient echocardiogram.  If you have a cardiologist follow-up with your cardiologist otherwise call your family doctor on the next business day to be seen and get an echocardiogram scheduled. 3.  Take all of your regular evening medications when you go home today. 4.  You have been prescribed Tamiflu to take for influenza.  You may take extra strength Tylenol for fever and bodyaches.  Try to keep fever controlled.  Try to stay hydrated.  This will help keep your heart rate down. 5.  You have been given a prescription for Hycodan syrup for cough.  He may take a teaspoon every 4 hours as needed for cough.  This may make you drowsy.  Do not drive or do activities that could result in injury when you are taking his medication.  May take [facility] for cough.  Swallow the Perel whole.  Do not chew on it or suck on it.  You may take 1 every 8 hours.  You feel that you are having any wheezing with your asthma use your albuterol.  If it anytime you feel like you are getting increasingly short of breath, lightheaded or any other concerning changes, return to emergency department immediately for recheck.

## 2022-11-20 LAB — CULTURE, BLOOD (ROUTINE X 2): Special Requests: ADEQUATE

## 2022-11-21 LAB — CULTURE, BLOOD (ROUTINE X 2): Culture: NO GROWTH

## 2022-11-23 LAB — CULTURE, BLOOD (ROUTINE X 2)

## 2022-11-24 LAB — CULTURE, BLOOD (ROUTINE X 2): Culture: NO GROWTH

## 2022-11-25 DIAGNOSIS — E1169 Type 2 diabetes mellitus with other specified complication: Secondary | ICD-10-CM | POA: Diagnosis not present

## 2022-11-25 DIAGNOSIS — I10 Essential (primary) hypertension: Secondary | ICD-10-CM | POA: Diagnosis not present

## 2022-11-25 DIAGNOSIS — B963 Hemophilus influenzae [H. influenzae] as the cause of diseases classified elsewhere: Secondary | ICD-10-CM | POA: Diagnosis not present

## 2022-11-25 DIAGNOSIS — Z6824 Body mass index (BMI) 24.0-24.9, adult: Secondary | ICD-10-CM | POA: Diagnosis not present

## 2022-11-25 DIAGNOSIS — F064 Anxiety disorder due to known physiological condition: Secondary | ICD-10-CM | POA: Diagnosis not present

## 2022-11-29 ENCOUNTER — Other Ambulatory Visit: Payer: Self-pay | Admitting: Cardiology

## 2022-11-29 DIAGNOSIS — I10 Essential (primary) hypertension: Secondary | ICD-10-CM

## 2022-12-04 ENCOUNTER — Encounter: Payer: Self-pay | Admitting: Podiatry

## 2022-12-04 ENCOUNTER — Ambulatory Visit (INDEPENDENT_AMBULATORY_CARE_PROVIDER_SITE_OTHER): Payer: Medicare PPO | Admitting: Podiatry

## 2022-12-04 DIAGNOSIS — B351 Tinea unguium: Secondary | ICD-10-CM

## 2022-12-04 DIAGNOSIS — M79675 Pain in left toe(s): Secondary | ICD-10-CM | POA: Diagnosis not present

## 2022-12-04 DIAGNOSIS — M79674 Pain in right toe(s): Secondary | ICD-10-CM | POA: Diagnosis not present

## 2022-12-04 NOTE — Progress Notes (Signed)
Subjective:   Patient ID: Mary Choi, female   DOB: 73 y.o.   MRN: [id number]   HPI Patient presents with nail disease 1-5 both feet and does have diabetes.  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

## 2022-12-05 DIAGNOSIS — N951 Menopausal and female climacteric states: Secondary | ICD-10-CM | POA: Diagnosis not present

## 2022-12-05 DIAGNOSIS — E1169 Type 2 diabetes mellitus with other specified complication: Secondary | ICD-10-CM | POA: Diagnosis not present

## 2022-12-09 DIAGNOSIS — M25511 Pain in right shoulder: Secondary | ICD-10-CM | POA: Diagnosis not present

## 2022-12-09 DIAGNOSIS — M546 Pain in thoracic spine: Secondary | ICD-10-CM | POA: Diagnosis not present

## 2022-12-16 DIAGNOSIS — E119 Type 2 diabetes mellitus without complications: Secondary | ICD-10-CM | POA: Diagnosis not present

## 2022-12-16 DIAGNOSIS — M2569 Stiffness of other specified joint, not elsewhere classified: Secondary | ICD-10-CM | POA: Diagnosis not present

## 2022-12-16 DIAGNOSIS — I10 Essential (primary) hypertension: Secondary | ICD-10-CM | POA: Diagnosis not present

## 2022-12-16 DIAGNOSIS — M6281 Muscle weakness (generalized): Secondary | ICD-10-CM | POA: Diagnosis not present

## 2022-12-16 DIAGNOSIS — M546 Pain in thoracic spine: Secondary | ICD-10-CM | POA: Diagnosis not present

## 2022-12-16 DIAGNOSIS — M799 Soft tissue disorder, unspecified: Secondary | ICD-10-CM | POA: Diagnosis not present

## 2022-12-18 DIAGNOSIS — I10 Essential (primary) hypertension: Secondary | ICD-10-CM | POA: Diagnosis not present

## 2022-12-18 DIAGNOSIS — M6281 Muscle weakness (generalized): Secondary | ICD-10-CM | POA: Diagnosis not present

## 2022-12-18 DIAGNOSIS — E119 Type 2 diabetes mellitus without complications: Secondary | ICD-10-CM | POA: Diagnosis not present

## 2022-12-18 DIAGNOSIS — M546 Pain in thoracic spine: Secondary | ICD-10-CM | POA: Diagnosis not present

## 2022-12-18 DIAGNOSIS — M2569 Stiffness of other specified joint, not elsewhere classified: Secondary | ICD-10-CM | POA: Diagnosis not present

## 2022-12-18 DIAGNOSIS — M799 Soft tissue disorder, unspecified: Secondary | ICD-10-CM | POA: Diagnosis not present

## 2022-12-26 DIAGNOSIS — I10 Essential (primary) hypertension: Secondary | ICD-10-CM | POA: Diagnosis not present

## 2022-12-26 DIAGNOSIS — M2569 Stiffness of other specified joint, not elsewhere classified: Secondary | ICD-10-CM | POA: Diagnosis not present

## 2022-12-26 DIAGNOSIS — M799 Soft tissue disorder, unspecified: Secondary | ICD-10-CM | POA: Diagnosis not present

## 2022-12-26 DIAGNOSIS — E119 Type 2 diabetes mellitus without complications: Secondary | ICD-10-CM | POA: Diagnosis not present

## 2022-12-26 DIAGNOSIS — M546 Pain in thoracic spine: Secondary | ICD-10-CM | POA: Diagnosis not present

## 2022-12-26 DIAGNOSIS — M6281 Muscle weakness (generalized): Secondary | ICD-10-CM | POA: Diagnosis not present

## 2022-12-27 DIAGNOSIS — I10 Essential (primary) hypertension: Secondary | ICD-10-CM | POA: Diagnosis not present

## 2022-12-27 DIAGNOSIS — M2569 Stiffness of other specified joint, not elsewhere classified: Secondary | ICD-10-CM | POA: Diagnosis not present

## 2022-12-27 DIAGNOSIS — M546 Pain in thoracic spine: Secondary | ICD-10-CM | POA: Diagnosis not present

## 2022-12-27 DIAGNOSIS — E119 Type 2 diabetes mellitus without complications: Secondary | ICD-10-CM | POA: Diagnosis not present

## 2022-12-27 DIAGNOSIS — M6281 Muscle weakness (generalized): Secondary | ICD-10-CM | POA: Diagnosis not present

## 2022-12-27 DIAGNOSIS — M799 Soft tissue disorder, unspecified: Secondary | ICD-10-CM | POA: Diagnosis not present

## 2022-12-30 DIAGNOSIS — M546 Pain in thoracic spine: Secondary | ICD-10-CM | POA: Diagnosis not present

## 2022-12-30 DIAGNOSIS — E119 Type 2 diabetes mellitus without complications: Secondary | ICD-10-CM | POA: Diagnosis not present

## 2022-12-30 DIAGNOSIS — M799 Soft tissue disorder, unspecified: Secondary | ICD-10-CM | POA: Diagnosis not present

## 2022-12-30 DIAGNOSIS — M6281 Muscle weakness (generalized): Secondary | ICD-10-CM | POA: Diagnosis not present

## 2022-12-30 DIAGNOSIS — I10 Essential (primary) hypertension: Secondary | ICD-10-CM | POA: Diagnosis not present

## 2022-12-30 DIAGNOSIS — M2569 Stiffness of other specified joint, not elsewhere classified: Secondary | ICD-10-CM | POA: Diagnosis not present

## 2023-01-07 DIAGNOSIS — E1169 Type 2 diabetes mellitus with other specified complication: Secondary | ICD-10-CM | POA: Diagnosis not present

## 2023-01-07 DIAGNOSIS — E785 Hyperlipidemia, unspecified: Secondary | ICD-10-CM | POA: Diagnosis not present

## 2023-01-07 DIAGNOSIS — I10 Essential (primary) hypertension: Secondary | ICD-10-CM | POA: Diagnosis not present

## 2023-01-13 DIAGNOSIS — B370 Candidal stomatitis: Secondary | ICD-10-CM | POA: Diagnosis not present

## 2023-01-13 DIAGNOSIS — K117 Disturbances of salivary secretion: Secondary | ICD-10-CM | POA: Diagnosis not present

## 2023-01-15 DIAGNOSIS — I10 Essential (primary) hypertension: Secondary | ICD-10-CM | POA: Diagnosis not present

## 2023-01-15 DIAGNOSIS — M2569 Stiffness of other specified joint, not elsewhere classified: Secondary | ICD-10-CM | POA: Diagnosis not present

## 2023-01-15 DIAGNOSIS — M6281 Muscle weakness (generalized): Secondary | ICD-10-CM | POA: Diagnosis not present

## 2023-01-15 DIAGNOSIS — M546 Pain in thoracic spine: Secondary | ICD-10-CM | POA: Diagnosis not present

## 2023-01-15 DIAGNOSIS — E119 Type 2 diabetes mellitus without complications: Secondary | ICD-10-CM | POA: Diagnosis not present

## 2023-01-15 DIAGNOSIS — M799 Soft tissue disorder, unspecified: Secondary | ICD-10-CM | POA: Diagnosis not present

## 2023-01-19 ENCOUNTER — Other Ambulatory Visit (HOSPITAL_BASED_OUTPATIENT_CLINIC_OR_DEPARTMENT_OTHER): Payer: Self-pay

## 2023-01-20 ENCOUNTER — Other Ambulatory Visit (HOSPITAL_BASED_OUTPATIENT_CLINIC_OR_DEPARTMENT_OTHER): Payer: Self-pay

## 2023-01-20 MED ORDER — OZEMPIC (0.25 OR 0.5 MG/DOSE) 2 MG/3ML ~~LOC~~ SOPN
0.5000 mg | PEN_INJECTOR | SUBCUTANEOUS | 1 refills | Status: DC
  Filled 2023-01-20: qty 3, 28d supply, fill #0
  Filled 2023-02-23: qty 3, 28d supply, fill #1

## 2023-01-21 ENCOUNTER — Other Ambulatory Visit (HOSPITAL_BASED_OUTPATIENT_CLINIC_OR_DEPARTMENT_OTHER): Payer: Self-pay

## 2023-01-22 ENCOUNTER — Other Ambulatory Visit (HOSPITAL_BASED_OUTPATIENT_CLINIC_OR_DEPARTMENT_OTHER): Payer: Self-pay

## 2023-01-22 DIAGNOSIS — I10 Essential (primary) hypertension: Secondary | ICD-10-CM | POA: Diagnosis not present

## 2023-01-22 DIAGNOSIS — M546 Pain in thoracic spine: Secondary | ICD-10-CM | POA: Diagnosis not present

## 2023-01-22 DIAGNOSIS — M799 Soft tissue disorder, unspecified: Secondary | ICD-10-CM | POA: Diagnosis not present

## 2023-01-22 DIAGNOSIS — M6281 Muscle weakness (generalized): Secondary | ICD-10-CM | POA: Diagnosis not present

## 2023-01-22 DIAGNOSIS — E119 Type 2 diabetes mellitus without complications: Secondary | ICD-10-CM | POA: Diagnosis not present

## 2023-01-22 DIAGNOSIS — M2569 Stiffness of other specified joint, not elsewhere classified: Secondary | ICD-10-CM | POA: Diagnosis not present

## 2023-01-23 ENCOUNTER — Other Ambulatory Visit (HOSPITAL_BASED_OUTPATIENT_CLINIC_OR_DEPARTMENT_OTHER): Payer: Self-pay

## 2023-01-24 DIAGNOSIS — E119 Type 2 diabetes mellitus without complications: Secondary | ICD-10-CM | POA: Diagnosis not present

## 2023-01-24 DIAGNOSIS — M546 Pain in thoracic spine: Secondary | ICD-10-CM | POA: Diagnosis not present

## 2023-01-24 DIAGNOSIS — I10 Essential (primary) hypertension: Secondary | ICD-10-CM | POA: Diagnosis not present

## 2023-01-24 DIAGNOSIS — M6281 Muscle weakness (generalized): Secondary | ICD-10-CM | POA: Diagnosis not present

## 2023-01-24 DIAGNOSIS — M2569 Stiffness of other specified joint, not elsewhere classified: Secondary | ICD-10-CM | POA: Diagnosis not present

## 2023-01-24 DIAGNOSIS — M799 Soft tissue disorder, unspecified: Secondary | ICD-10-CM | POA: Diagnosis not present

## 2023-01-27 DIAGNOSIS — M546 Pain in thoracic spine: Secondary | ICD-10-CM | POA: Diagnosis not present

## 2023-01-27 DIAGNOSIS — M6281 Muscle weakness (generalized): Secondary | ICD-10-CM | POA: Diagnosis not present

## 2023-01-27 DIAGNOSIS — I10 Essential (primary) hypertension: Secondary | ICD-10-CM | POA: Diagnosis not present

## 2023-01-27 DIAGNOSIS — M799 Soft tissue disorder, unspecified: Secondary | ICD-10-CM | POA: Diagnosis not present

## 2023-01-27 DIAGNOSIS — E119 Type 2 diabetes mellitus without complications: Secondary | ICD-10-CM | POA: Diagnosis not present

## 2023-01-27 DIAGNOSIS — M2569 Stiffness of other specified joint, not elsewhere classified: Secondary | ICD-10-CM | POA: Diagnosis not present

## 2023-01-29 DIAGNOSIS — M546 Pain in thoracic spine: Secondary | ICD-10-CM | POA: Diagnosis not present

## 2023-01-29 DIAGNOSIS — M6281 Muscle weakness (generalized): Secondary | ICD-10-CM | POA: Diagnosis not present

## 2023-01-29 DIAGNOSIS — M799 Soft tissue disorder, unspecified: Secondary | ICD-10-CM | POA: Diagnosis not present

## 2023-01-29 DIAGNOSIS — I10 Essential (primary) hypertension: Secondary | ICD-10-CM | POA: Diagnosis not present

## 2023-01-29 DIAGNOSIS — M2569 Stiffness of other specified joint, not elsewhere classified: Secondary | ICD-10-CM | POA: Diagnosis not present

## 2023-01-29 DIAGNOSIS — E119 Type 2 diabetes mellitus without complications: Secondary | ICD-10-CM | POA: Diagnosis not present

## 2023-02-03 DIAGNOSIS — M6281 Muscle weakness (generalized): Secondary | ICD-10-CM | POA: Diagnosis not present

## 2023-02-03 DIAGNOSIS — M2569 Stiffness of other specified joint, not elsewhere classified: Secondary | ICD-10-CM | POA: Diagnosis not present

## 2023-02-03 DIAGNOSIS — M546 Pain in thoracic spine: Secondary | ICD-10-CM | POA: Diagnosis not present

## 2023-02-03 DIAGNOSIS — I10 Essential (primary) hypertension: Secondary | ICD-10-CM | POA: Diagnosis not present

## 2023-02-03 DIAGNOSIS — E119 Type 2 diabetes mellitus without complications: Secondary | ICD-10-CM | POA: Diagnosis not present

## 2023-02-03 DIAGNOSIS — M799 Soft tissue disorder, unspecified: Secondary | ICD-10-CM | POA: Diagnosis not present

## 2023-02-05 DIAGNOSIS — M79662 Pain in left lower leg: Secondary | ICD-10-CM | POA: Diagnosis not present

## 2023-02-14 ENCOUNTER — Other Ambulatory Visit: Payer: Self-pay | Admitting: Cardiology

## 2023-02-14 DIAGNOSIS — E1169 Type 2 diabetes mellitus with other specified complication: Secondary | ICD-10-CM | POA: Diagnosis not present

## 2023-02-14 DIAGNOSIS — E785 Hyperlipidemia, unspecified: Secondary | ICD-10-CM | POA: Diagnosis not present

## 2023-02-14 DIAGNOSIS — M6281 Muscle weakness (generalized): Secondary | ICD-10-CM | POA: Diagnosis not present

## 2023-02-14 DIAGNOSIS — E119 Type 2 diabetes mellitus without complications: Secondary | ICD-10-CM | POA: Diagnosis not present

## 2023-02-14 DIAGNOSIS — I10 Essential (primary) hypertension: Secondary | ICD-10-CM | POA: Diagnosis not present

## 2023-02-14 DIAGNOSIS — M799 Soft tissue disorder, unspecified: Secondary | ICD-10-CM | POA: Diagnosis not present

## 2023-02-14 DIAGNOSIS — M546 Pain in thoracic spine: Secondary | ICD-10-CM | POA: Diagnosis not present

## 2023-02-14 DIAGNOSIS — M2569 Stiffness of other specified joint, not elsewhere classified: Secondary | ICD-10-CM | POA: Diagnosis not present

## 2023-02-17 DIAGNOSIS — H0102B Squamous blepharitis left eye, upper and lower eyelids: Secondary | ICD-10-CM | POA: Diagnosis not present

## 2023-02-17 DIAGNOSIS — H25813 Combined forms of age-related cataract, bilateral: Secondary | ICD-10-CM | POA: Diagnosis not present

## 2023-02-17 DIAGNOSIS — F411 Generalized anxiety disorder: Secondary | ICD-10-CM | POA: Diagnosis not present

## 2023-02-17 DIAGNOSIS — H0102A Squamous blepharitis right eye, upper and lower eyelids: Secondary | ICD-10-CM | POA: Diagnosis not present

## 2023-02-17 DIAGNOSIS — R634 Abnormal weight loss: Secondary | ICD-10-CM | POA: Diagnosis not present

## 2023-02-17 DIAGNOSIS — E1169 Type 2 diabetes mellitus with other specified complication: Secondary | ICD-10-CM | POA: Diagnosis not present

## 2023-02-17 DIAGNOSIS — I10 Essential (primary) hypertension: Secondary | ICD-10-CM | POA: Diagnosis not present

## 2023-02-17 DIAGNOSIS — E785 Hyperlipidemia, unspecified: Secondary | ICD-10-CM | POA: Diagnosis not present

## 2023-02-17 DIAGNOSIS — E441 Mild protein-calorie malnutrition: Secondary | ICD-10-CM | POA: Diagnosis not present

## 2023-02-17 DIAGNOSIS — H1045 Other chronic allergic conjunctivitis: Secondary | ICD-10-CM | POA: Diagnosis not present

## 2023-02-17 DIAGNOSIS — E119 Type 2 diabetes mellitus without complications: Secondary | ICD-10-CM | POA: Diagnosis not present

## 2023-02-17 DIAGNOSIS — H04123 Dry eye syndrome of bilateral lacrimal glands: Secondary | ICD-10-CM | POA: Diagnosis not present

## 2023-03-22 ENCOUNTER — Ambulatory Visit
Admission: EM | Admit: 2023-03-22 | Discharge: 2023-03-22 | Disposition: A | Discharge status: Home / Self Care | Payer: Medicare PPO

## 2023-03-22 DIAGNOSIS — K047 Periapical abscess without sinus: Secondary | ICD-10-CM | POA: Diagnosis not present

## 2023-03-22 MED ORDER — AMOXICILLIN-POT CLAVULANATE 875-125 MG PO TABS: 1.0000 | ORAL_TABLET | Freq: Two times a day (BID) | ORAL | 0 refills | Status: DC

## 2023-03-22 NOTE — Discharge Instructions (Addendum)
Antibiotic has been prescribed for dental abscess.  Please follow-up with your dentist on Monday.

## 2023-03-22 NOTE — ED Triage Notes (Signed)
Pt states that she has an abscess on the roof of her mouth. X2 days

## 2023-03-22 NOTE — ED Provider Notes (Signed)
[facility]    CSN: [id number] Arrival date & time: 03/22/23  1252      History   Chief Complaint Chief Complaint  Patient presents with   Abscess    Abscess on roof of mouth x2 days    HPI Mary Choi is a 72 y.o. female.   Patient presents with bump to right upper gum that she noticed 2 days ago.  She is concerned for dental abscess.  Denies any drainage from the area.  Denies any fever.   Abscess   Past Medical History:  Diagnosis Date   Allergy    Anemia    Asthma    Breast cancer (HCC) 1996   Cancer (HCC)    Diabetes mellitus without complication (HCC)    Hyperlipidemia    Hypertension     Patient Active Problem List   Diagnosis Date Noted   Dislocation 01/26/2015   Ankle fracture 01/25/2015   HTN (hypertension) 01/25/2015   HLD (hyperlipidemia) 01/25/2015   Asthma 01/25/2015   Diabetes (HCC) 01/07/2013    Past Surgical History:  Procedure Laterality Date   ABDOMINAL HYSTERECTOMY     BREAST SURGERY     CESAREAN SECTION     MASTECTOMY Right    ORIF ANKLE FRACTURE Right 01/31/2015   Procedure: OPEN REDUCTION INTERNAL FIXATION (ORIF) ANKLE FRACTURE;  Surgeon: Sheral Apley, MD;  Location: [facility];  Service: Orthopedics;  Laterality: Right;    OB History   No obstetric history on file.      Home Medications    Prior to Admission medications   Medication Sig Start Date End Date Taking? Authorizing Provider  acetaminophen (TYLENOL) 500 MG tablet Take 1,000 mg by mouth every 6 (six) hours as needed for mild pain.   Yes [provider]  albuterol (PROVENTIL HFA;VENTOLIN HFA) 108 (90 BASE) MCG/ACT inhaler Inhale 2 puffs into the lungs every 6 (six) hours as needed for wheezing.   Yes [provider]  amoxicillin-clavulanate (AUGMENTIN) 875-125 MG tablet Take 1 tablet by mouth every 12 (twelve) hours. 03/22/23  Yes Sandip Power, Rolly Salter E, FNP  baclofen (LIORESAL) 10 MG tablet Take 10 mg by mouth 4 (four)  times daily.   Yes [provider]  benzonatate (TESSALON) 100 MG capsule Take 1 capsule (100 mg total) by mouth every 8 (eight) hours. 11/19/22  Yes Arby Barrette, MD  cholecalciferol (VITAMIN D3) 25 MCG (1000 UNIT) tablet Take 1,000 Units by mouth daily.   Yes [provider]  diltiazem (CARDIZEM CD) 240 MG 24 hr capsule Take 1 capsule (240 mg total) by mouth daily. 08/14/22 08/09/23 Yes Yates Decamp, MD  FARXIGA 10 MG TABS tablet Take by mouth. 12/13/22  Yes [provider]  HYDROcodone bit-homatropine (HYCODAN) 5-1.5 MG/5ML syrup Take 5 mLs by mouth every 4 (four) hours as needed for cough. 11/19/22  Yes Pfeiffer, Lebron Conners, MD  insulin glargine, 2 Unit Dial, (TOUJEO MAX SOLOSTAR) 300 UNIT/ML Solostar Pen Inject 10 Units into the skin at bedtime.   Yes [provider]  losartan-hydrochlorothiazide (HYZAAR) 100-25 MG per tablet Take 1 tablet by mouth daily.   Yes [provider]  metFORMIN (GLUCOPHAGE) 1000 MG tablet Take 1,000 mg by mouth at bedtime.    Yes [provider]  OZEMPIC, 0.25 OR 0.5 MG/DOSE, 2 MG/3ML SOPN Inject 0.5 mg into the skin once a week. 01/20/23  Yes   pioglitazone (ACTOS) 15 MG tablet Take 15 mg by mouth every other day.  Yes [provider]  Potassium Chloride Crys ER (KLOR-CON M20 PO) Take 1 tablet by mouth daily at 12 noon.   Yes [provider]  rosuvastatin (CRESTOR) 10 MG tablet Take 10 mg by mouth daily.   Yes [provider]  Semaglutide,0.25 or 0.5MG /DOS, (OZEMPIC, 0.25 OR 0.5 MG/DOSE,) 2 MG/3ML SOPN Inject 0.5 mLs into the skin once a week. 11/04/22  Yes   Vilazodone HCl (VIIBRYD) 40 MG TABS    Yes [provider]  fluconazole (DIFLUCAN) 200 MG tablet Take 200 mg by mouth daily. 08/28/21   [provider]  oseltamivir (TAMIFLU) 75 MG capsule Take 1 capsule (75 mg total) by mouth every 12 (twelve) hours. 11/19/22   Arby Barrette, MD    Family History Family History  Problem  Relation Age of Onset   Diabetes Mother    Hypertension Mother    Heart disease Brother    Pancreatic cancer Maternal Grandfather    Colon cancer Neg Hx    Esophageal cancer Neg Hx    Liver cancer Neg Hx    Rectal cancer Neg Hx    Stomach cancer Neg Hx     Social History Social History   Tobacco Use   Smoking status: Never   Smokeless tobacco: Never  Vaping Use   Vaping status: Never Used  Substance Use Topics   Alcohol use: Not Currently    Comment: social    Drug use: No     Allergies   Patient has no known allergies.   Review of Systems Review of Systems Per HPI  Physical Exam Triage Vital Signs ED Triage Vitals  Encounter Vitals Group     BP 03/22/23 1350 115/80     Systolic BP Percentile --      Diastolic BP Percentile --      Pulse Rate 03/22/23 1350 84     Resp 03/22/23 1350 18     Temp 03/22/23 1350 98.2 F (36.8 C)     Temp Source 03/22/23 1350 Oral     SpO2 03/22/23 1350 98 %     Weight 03/22/23 1348 132 lb (59.9 kg)     Height 03/22/23 1348 5' 2.5" (1.588 m)     Head Circumference --      Peak Flow --      Pain Score 03/22/23 1348 0     Pain Loc --      Pain Education --      Exclude from Growth Chart --    No data found.  Updated Vital Signs BP 115/80 (BP Location: Left Arm)   Pulse 84   Temp 98.2 F (36.8 C) (Oral)   Resp 18   Ht 5' 2.5" (1.588 m)   Wt 132 lb (59.9 kg)   SpO2 98%   BMI 23.76 kg/m   Visual Acuity Right Eye Distance:   Left Eye Distance:   Bilateral Distance:    Right Eye Near:   Left Eye Near:    Bilateral Near:     Physical Exam Constitutional:      General: She is not in acute distress.    Appearance: Normal appearance. She is not toxic-appearing or diaphoretic.  HENT:     Head: Normocephalic and atraumatic.     Mouth/Throat:     Comments: Patient has pinpoint pustular lesion present to right anterior upper gums. Eyes:     Extraocular Movements: Extraocular movements intact.      Conjunctiva/sclera: Conjunctivae normal.  Pulmonary:  Effort: Pulmonary effort is normal.  Neurological:     General: No focal deficit present.     Mental Status: She is alert and oriented to person, place, and time. Mental status is at baseline.  Psychiatric:        Mood and Affect: Mood normal.        Behavior: Behavior normal.        Thought Content: Thought content normal.        Judgment: Judgment normal.      UC Treatments / Results  Labs (all labs ordered are listed, but only abnormal results are displayed) Labs Reviewed - No data to display  EKG   Radiology No results found.  Procedures Procedures (including critical care time)  Medications Ordered in UC Medications - No data to display  Initial Impression / Assessment and Plan / UC Course  I have reviewed the triage vital signs and the nursing notes.  Pertinent labs & imaging results that were available during my care of the patient were reviewed by me and considered in my medical decision making (see chart for details).     Physical exam is consistent with very small dental abscess.  Will treat with Augmentin.  Creatinine clearance is 46 so no dosage adjustment necessary.  Patient advised to follow-up with her established dentist for further evaluation and management.  Patient verbalized understanding and was agreeable with plan. Final Clinical Impressions(s) / UC Diagnoses   Final diagnoses:  Abscess, dental     Discharge Instructions      Antibiotic has been prescribed for dental abscess.  Please follow-up with your dentist on Monday.    ED Prescriptions     Medication Sig Dispense Auth. Provider   amoxicillin-clavulanate (AUGMENTIN) 875-125 MG tablet Take 1 tablet by mouth every 12 (twelve) hours. 14 tablet [place], Acie Fredricks[place], [place]      PDMP not reviewed this encounter.   Gustavus Bryant, [place] 03/22/23 [phone]

## 2023-03-31 ENCOUNTER — Other Ambulatory Visit (HOSPITAL_BASED_OUTPATIENT_CLINIC_OR_DEPARTMENT_OTHER): Payer: Self-pay

## 2023-04-02 ENCOUNTER — Other Ambulatory Visit (HOSPITAL_BASED_OUTPATIENT_CLINIC_OR_DEPARTMENT_OTHER): Payer: Self-pay

## 2023-04-02 MED ORDER — OZEMPIC (0.25 OR 0.5 MG/DOSE) 2 MG/3ML ~~LOC~~ SOPN
0.5000 mg | PEN_INJECTOR | SUBCUTANEOUS | 1 refills | Status: DC
  Filled 2023-04-02: qty 3, 28d supply, fill #0
  Filled 2023-04-28: qty 3, 28d supply, fill #1

## 2023-04-28 ENCOUNTER — Other Ambulatory Visit (HOSPITAL_BASED_OUTPATIENT_CLINIC_OR_DEPARTMENT_OTHER): Payer: Self-pay

## 2023-04-30 DIAGNOSIS — Z113 Encounter for screening for infections with a predominantly sexual mode of transmission: Secondary | ICD-10-CM | POA: Diagnosis not present

## 2023-04-30 DIAGNOSIS — I10 Essential (primary) hypertension: Secondary | ICD-10-CM | POA: Diagnosis not present

## 2023-04-30 DIAGNOSIS — Z0000 Encounter for general adult medical examination without abnormal findings: Secondary | ICD-10-CM | POA: Diagnosis not present

## 2023-04-30 DIAGNOSIS — E785 Hyperlipidemia, unspecified: Secondary | ICD-10-CM | POA: Diagnosis not present

## 2023-04-30 DIAGNOSIS — F064 Anxiety disorder due to known physiological condition: Secondary | ICD-10-CM | POA: Diagnosis not present

## 2023-04-30 DIAGNOSIS — E1169 Type 2 diabetes mellitus with other specified complication: Secondary | ICD-10-CM | POA: Diagnosis not present

## 2023-04-30 DIAGNOSIS — Z0001 Encounter for general adult medical examination with abnormal findings: Secondary | ICD-10-CM | POA: Diagnosis not present

## 2023-04-30 DIAGNOSIS — Z23 Encounter for immunization: Secondary | ICD-10-CM | POA: Diagnosis not present

## 2023-05-21 ENCOUNTER — Other Ambulatory Visit: Payer: Self-pay | Admitting: Family Medicine

## 2023-05-21 DIAGNOSIS — Z1231 Encounter for screening mammogram for malignant neoplasm of breast: Secondary | ICD-10-CM

## 2023-05-25 ENCOUNTER — Other Ambulatory Visit (HOSPITAL_BASED_OUTPATIENT_CLINIC_OR_DEPARTMENT_OTHER): Payer: Self-pay

## 2023-05-26 ENCOUNTER — Other Ambulatory Visit (HOSPITAL_BASED_OUTPATIENT_CLINIC_OR_DEPARTMENT_OTHER): Payer: Self-pay

## 2023-05-26 MED ORDER — OZEMPIC (0.25 OR 0.5 MG/DOSE) 2 MG/3ML ~~LOC~~ SOPN
0.5000 mg | PEN_INJECTOR | SUBCUTANEOUS | 1 refills | Status: DC
  Filled 2023-05-26 – 2023-12-16 (×3): qty 3, 28d supply, fill #0
  Filled 2024-01-17: qty 3, 28d supply, fill #1

## 2023-05-27 ENCOUNTER — Other Ambulatory Visit (HOSPITAL_BASED_OUTPATIENT_CLINIC_OR_DEPARTMENT_OTHER): Payer: Self-pay

## 2023-05-29 ENCOUNTER — Other Ambulatory Visit (HOSPITAL_BASED_OUTPATIENT_CLINIC_OR_DEPARTMENT_OTHER): Payer: Self-pay

## 2023-06-23 DIAGNOSIS — E1169 Type 2 diabetes mellitus with other specified complication: Secondary | ICD-10-CM | POA: Diagnosis not present

## 2023-06-23 DIAGNOSIS — E782 Mixed hyperlipidemia: Secondary | ICD-10-CM | POA: Diagnosis not present

## 2023-06-23 DIAGNOSIS — E559 Vitamin D deficiency, unspecified: Secondary | ICD-10-CM | POA: Diagnosis not present

## 2023-06-23 DIAGNOSIS — I10 Essential (primary) hypertension: Secondary | ICD-10-CM | POA: Diagnosis not present

## 2023-06-30 DIAGNOSIS — E1169 Type 2 diabetes mellitus with other specified complication: Secondary | ICD-10-CM | POA: Diagnosis not present

## 2023-06-30 DIAGNOSIS — G4730 Sleep apnea, unspecified: Secondary | ICD-10-CM | POA: Diagnosis not present

## 2023-06-30 DIAGNOSIS — F064 Anxiety disorder due to known physiological condition: Secondary | ICD-10-CM | POA: Diagnosis not present

## 2023-06-30 DIAGNOSIS — I10 Essential (primary) hypertension: Secondary | ICD-10-CM | POA: Diagnosis not present

## 2023-06-30 DIAGNOSIS — E785 Hyperlipidemia, unspecified: Secondary | ICD-10-CM | POA: Diagnosis not present

## 2023-07-02 ENCOUNTER — Ambulatory Visit: Payer: Medicare PPO

## 2023-07-21 DIAGNOSIS — B370 Candidal stomatitis: Secondary | ICD-10-CM | POA: Insufficient documentation

## 2023-07-21 DIAGNOSIS — K143 Hypertrophy of tongue papillae: Secondary | ICD-10-CM

## 2023-07-21 HISTORY — DX: Hypertrophy of tongue papillae: K14.3

## 2023-07-21 HISTORY — DX: Candidal stomatitis: B37.0

## 2023-07-27 ENCOUNTER — Other Ambulatory Visit (HOSPITAL_BASED_OUTPATIENT_CLINIC_OR_DEPARTMENT_OTHER): Payer: Self-pay

## 2023-07-27 MED ORDER — SEMAGLUTIDE(0.25 OR 0.5MG/DOS) 2 MG/3ML ~~LOC~~ SOPN
0.5000 mL | PEN_INJECTOR | SUBCUTANEOUS | 1 refills | Status: DC
  Filled 2023-07-27: qty 3, 28d supply, fill #0
  Filled 2023-08-24: qty 3, 28d supply, fill #1

## 2023-07-28 ENCOUNTER — Ambulatory Visit
Admission: RE | Admit: 2023-07-28 | Discharge: 2023-07-28 | Disposition: A | Discharge status: Home / Self Care | Payer: Medicare PPO | Source: Ambulatory Visit | Attending: Family Medicine | Admitting: Family Medicine

## 2023-07-28 ENCOUNTER — Other Ambulatory Visit (HOSPITAL_BASED_OUTPATIENT_CLINIC_OR_DEPARTMENT_OTHER): Payer: Self-pay

## 2023-07-28 DIAGNOSIS — Z1231 Encounter for screening mammogram for malignant neoplasm of breast: Secondary | ICD-10-CM | POA: Diagnosis not present

## 2023-09-26 DIAGNOSIS — E1169 Type 2 diabetes mellitus with other specified complication: Secondary | ICD-10-CM | POA: Diagnosis not present

## 2023-09-26 DIAGNOSIS — I10 Essential (primary) hypertension: Secondary | ICD-10-CM | POA: Diagnosis not present

## 2023-09-26 DIAGNOSIS — F064 Anxiety disorder due to known physiological condition: Secondary | ICD-10-CM | POA: Diagnosis not present

## 2023-09-26 DIAGNOSIS — E785 Hyperlipidemia, unspecified: Secondary | ICD-10-CM | POA: Diagnosis not present

## 2023-09-29 ENCOUNTER — Other Ambulatory Visit (HOSPITAL_BASED_OUTPATIENT_CLINIC_OR_DEPARTMENT_OTHER): Payer: Self-pay

## 2023-09-29 DIAGNOSIS — I10 Essential (primary) hypertension: Secondary | ICD-10-CM | POA: Diagnosis not present

## 2023-09-29 DIAGNOSIS — E1169 Type 2 diabetes mellitus with other specified complication: Secondary | ICD-10-CM | POA: Diagnosis not present

## 2023-09-30 MED ORDER — SEMAGLUTIDE(0.25 OR 0.5MG/DOS) 2 MG/3ML ~~LOC~~ SOPN
0.5000 mg | PEN_INJECTOR | SUBCUTANEOUS | 1 refills | Status: DC
  Filled 2023-09-30: qty 3, 28d supply, fill #0
  Filled 2023-10-26: qty 3, 28d supply, fill #1

## 2023-10-01 ENCOUNTER — Other Ambulatory Visit (HOSPITAL_BASED_OUTPATIENT_CLINIC_OR_DEPARTMENT_OTHER): Payer: Self-pay

## 2023-10-29 DIAGNOSIS — E118 Type 2 diabetes mellitus with unspecified complications: Secondary | ICD-10-CM | POA: Diagnosis not present

## 2023-10-30 DIAGNOSIS — E1169 Type 2 diabetes mellitus with other specified complication: Secondary | ICD-10-CM | POA: Diagnosis not present

## 2023-11-03 DIAGNOSIS — R300 Dysuria: Secondary | ICD-10-CM | POA: Diagnosis not present

## 2023-11-03 DIAGNOSIS — Z01411 Encounter for gynecological examination (general) (routine) with abnormal findings: Secondary | ICD-10-CM | POA: Diagnosis not present

## 2023-11-13 DIAGNOSIS — E118 Type 2 diabetes mellitus with unspecified complications: Secondary | ICD-10-CM | POA: Diagnosis not present

## 2023-11-13 DIAGNOSIS — R682 Dry mouth, unspecified: Secondary | ICD-10-CM | POA: Diagnosis not present

## 2023-11-26 DIAGNOSIS — L720 Epidermal cyst: Secondary | ICD-10-CM | POA: Diagnosis not present

## 2023-11-26 DIAGNOSIS — L820 Inflamed seborrheic keratosis: Secondary | ICD-10-CM | POA: Diagnosis not present

## 2023-11-27 DIAGNOSIS — E118 Type 2 diabetes mellitus with unspecified complications: Secondary | ICD-10-CM | POA: Diagnosis not present

## 2023-12-05 ENCOUNTER — Other Ambulatory Visit (HOSPITAL_BASED_OUTPATIENT_CLINIC_OR_DEPARTMENT_OTHER): Payer: Self-pay

## 2023-12-15 ENCOUNTER — Other Ambulatory Visit (HOSPITAL_BASED_OUTPATIENT_CLINIC_OR_DEPARTMENT_OTHER): Payer: Self-pay

## 2023-12-16 ENCOUNTER — Other Ambulatory Visit (HOSPITAL_BASED_OUTPATIENT_CLINIC_OR_DEPARTMENT_OTHER): Payer: Self-pay

## 2023-12-25 DIAGNOSIS — E1169 Type 2 diabetes mellitus with other specified complication: Secondary | ICD-10-CM | POA: Diagnosis not present

## 2023-12-25 DIAGNOSIS — I10 Essential (primary) hypertension: Secondary | ICD-10-CM | POA: Diagnosis not present

## 2023-12-25 DIAGNOSIS — E782 Mixed hyperlipidemia: Secondary | ICD-10-CM | POA: Diagnosis not present

## 2024-01-14 DIAGNOSIS — M546 Pain in thoracic spine: Secondary | ICD-10-CM | POA: Diagnosis not present

## 2024-01-14 DIAGNOSIS — Z1330 Encounter for screening examination for mental health and behavioral disorders, unspecified: Secondary | ICD-10-CM | POA: Diagnosis not present

## 2024-01-14 DIAGNOSIS — G894 Chronic pain syndrome: Secondary | ICD-10-CM | POA: Diagnosis not present

## 2024-01-17 ENCOUNTER — Other Ambulatory Visit (HOSPITAL_BASED_OUTPATIENT_CLINIC_OR_DEPARTMENT_OTHER): Payer: Self-pay

## 2024-02-09 DIAGNOSIS — G4730 Sleep apnea, unspecified: Secondary | ICD-10-CM | POA: Diagnosis not present

## 2024-02-09 DIAGNOSIS — F411 Generalized anxiety disorder: Secondary | ICD-10-CM | POA: Diagnosis not present

## 2024-02-09 DIAGNOSIS — I10 Essential (primary) hypertension: Secondary | ICD-10-CM | POA: Diagnosis not present

## 2024-02-09 DIAGNOSIS — E1169 Type 2 diabetes mellitus with other specified complication: Secondary | ICD-10-CM | POA: Diagnosis not present

## 2024-02-09 DIAGNOSIS — E782 Mixed hyperlipidemia: Secondary | ICD-10-CM | POA: Diagnosis not present

## 2024-02-09 DIAGNOSIS — R0602 Shortness of breath: Secondary | ICD-10-CM | POA: Diagnosis not present

## 2024-02-25 DIAGNOSIS — H0102A Squamous blepharitis right eye, upper and lower eyelids: Secondary | ICD-10-CM | POA: Diagnosis not present

## 2024-02-25 DIAGNOSIS — E119 Type 2 diabetes mellitus without complications: Secondary | ICD-10-CM | POA: Diagnosis not present

## 2024-02-25 DIAGNOSIS — H25813 Combined forms of age-related cataract, bilateral: Secondary | ICD-10-CM | POA: Diagnosis not present

## 2024-03-19 DIAGNOSIS — I10 Essential (primary) hypertension: Secondary | ICD-10-CM | POA: Diagnosis not present

## 2024-03-19 DIAGNOSIS — Z6824 Body mass index (BMI) 24.0-24.9, adult: Secondary | ICD-10-CM | POA: Diagnosis not present

## 2024-03-19 DIAGNOSIS — E118 Type 2 diabetes mellitus with unspecified complications: Secondary | ICD-10-CM | POA: Diagnosis not present

## 2024-03-19 DIAGNOSIS — E782 Mixed hyperlipidemia: Secondary | ICD-10-CM | POA: Diagnosis not present

## 2024-03-19 DIAGNOSIS — R0602 Shortness of breath: Secondary | ICD-10-CM | POA: Diagnosis not present

## 2024-03-19 DIAGNOSIS — F411 Generalized anxiety disorder: Secondary | ICD-10-CM | POA: Diagnosis not present

## 2024-03-19 DIAGNOSIS — G4730 Sleep apnea, unspecified: Secondary | ICD-10-CM | POA: Diagnosis not present

## 2024-03-22 DIAGNOSIS — I10 Essential (primary) hypertension: Secondary | ICD-10-CM | POA: Diagnosis not present

## 2024-03-22 DIAGNOSIS — E1169 Type 2 diabetes mellitus with other specified complication: Secondary | ICD-10-CM | POA: Diagnosis not present

## 2024-03-31 ENCOUNTER — Other Ambulatory Visit (HOSPITAL_BASED_OUTPATIENT_CLINIC_OR_DEPARTMENT_OTHER): Payer: Self-pay

## 2024-03-31 MED ORDER — OZEMPIC (0.25 OR 0.5 MG/DOSE) 2 MG/3ML ~~LOC~~ SOPN
0.5000 mg | PEN_INJECTOR | SUBCUTANEOUS | 1 refills | Status: DC
  Filled 2024-03-31: qty 3, 28d supply, fill #0
  Filled 2024-05-09: qty 3, 28d supply, fill #1

## 2024-04-01 ENCOUNTER — Other Ambulatory Visit (HOSPITAL_BASED_OUTPATIENT_CLINIC_OR_DEPARTMENT_OTHER): Payer: Self-pay

## 2024-04-13 ENCOUNTER — Ambulatory Visit: Admitting: Cardiology

## 2024-04-13 ENCOUNTER — Other Ambulatory Visit: Payer: Self-pay

## 2024-04-13 DIAGNOSIS — T7840XA Allergy, unspecified, initial encounter: Secondary | ICD-10-CM | POA: Insufficient documentation

## 2024-04-13 DIAGNOSIS — C801 Malignant (primary) neoplasm, unspecified: Secondary | ICD-10-CM | POA: Insufficient documentation

## 2024-04-13 DIAGNOSIS — D649 Anemia, unspecified: Secondary | ICD-10-CM | POA: Insufficient documentation

## 2024-04-14 ENCOUNTER — Ambulatory Visit: Attending: Cardiology | Admitting: Cardiology

## 2024-04-14 ENCOUNTER — Encounter: Payer: Self-pay | Admitting: Cardiology

## 2024-04-14 VITALS — BP 118/72 | HR 79 | Ht 64.0 in | Wt 135.8 lb

## 2024-04-14 DIAGNOSIS — R0609 Other forms of dyspnea: Secondary | ICD-10-CM | POA: Insufficient documentation

## 2024-04-14 DIAGNOSIS — R0789 Other chest pain: Secondary | ICD-10-CM | POA: Diagnosis not present

## 2024-04-14 DIAGNOSIS — I10 Essential (primary) hypertension: Secondary | ICD-10-CM | POA: Diagnosis not present

## 2024-04-14 DIAGNOSIS — E119 Type 2 diabetes mellitus without complications: Secondary | ICD-10-CM | POA: Diagnosis not present

## 2024-04-14 DIAGNOSIS — R072 Precordial pain: Secondary | ICD-10-CM

## 2024-04-14 DIAGNOSIS — E785 Hyperlipidemia, unspecified: Secondary | ICD-10-CM

## 2024-04-14 MED ORDER — METOPROLOL TARTRATE 100 MG PO TABS: ORAL_TABLET | ORAL | 0 refills | Status: DC

## 2024-04-14 MED ORDER — ASPIRIN 81 MG PO TBEC: 81.0000 mg | DELAYED_RELEASE_TABLET | Freq: Every day | ORAL | Status: AC

## 2024-04-14 NOTE — Progress Notes (Unsigned)
 Cardiology Consultation:    Date:  04/14/2024   ID:  Rudolph LITTIE Hawk, DOB 1951-05-12, MRN [id number]  PCP:  Benjamine Aland, MD  Cardiologist:  Lamar Fitch, MD   Referring MD: Benjamine Aland, MD   No chief complaint on file. I think I have something regarding the heart, I am short of breath  History of Present Illness:    Mary Choi is a 73 y.o. female who is being seen today for the evaluation of dyspnea on exertion atypical chest pain at the request of Benjamine Aland, MD. past medical history significant for longstanding diabetes, essential hypertension, dyslipidemia, she never smoked, she was referred to us  because of dyspnea on exertion.  She had evaluation done for similar problem in 2022 in 2023 and that evaluation has been unrevealing it included echocardiogram as well as stress test.  Both were negative.  However shortness of breath appears to be progressing on top of that she also developed some chest heaviness sometimes when she tried to do some exercise.  That make her very worried.  This sensation gradually getting worse over the last 6 months still sensation of the chest is relatively rare.  She can go to store and do shopping can go from front to back of [employer] with no difficulties.  She never smoked does have multiple risk factors for coronary disease.  She does have family history of premature coronary artery disease.  She does not exercise on the regular basis she is not on a special diet.  Her diabetes apparently was poorly controlled previously with hemoglobin A1c more than 8 however now she uses CGM which allowed her to control her diabetes much better.  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   Hyperlipidemia    Hypertension    Oral candida 07/21/2023   Seborrheic keratosis 07/17/2022    Past Surgical History:  Procedure Laterality Date   ABDOMINAL HYSTERECTOMY     BREAST SURGERY     CESAREAN SECTION     MASTECTOMY Right    ORIF ANKLE FRACTURE Right 01/31/2015   Procedure: OPEN REDUCTION INTERNAL FIXATION (ORIF) ANKLE FRACTURE;  Surgeon: Evalene JONETTA Chancy, MD;  Location: [facility];  Service: Orthopedics;  Laterality: Right;    Current Medications: Current Meds  Medication Sig   acetaminophen  (TYLENOL ) 500 MG tablet Take 1,000 mg by mouth every 6 (six) hours as needed for mild pain.   albuterol  (PROVENTIL  HFA;VENTOLIN  HFA) 108 (90 BASE) MCG/ACT inhaler Inhale 2 puffs into the lungs every 6 (six) hours as needed for wheezing.   baclofen (LIORESAL) 10 MG tablet Take 10 mg by mouth 4 (four) times daily.   busPIRone (BUSPAR) 10 MG tablet Take 10 mg by mouth 2 (two) times daily.   cholecalciferol (VITAMIN D3) 25 MCG (1000 UNIT) tablet Take 1,000 Units by mouth daily.   Continuous Glucose Sensor (FREESTYLE LIBRE 3 SENSOR) MISC Apply topically every 3 (three) days.   EMBECTA PEN NEEDLE NANO 2 GEN 32G X 4 MM MISC as directed.   FARXIGA 10 MG TABS tablet Take by mouth.  fluconazole (DIFLUCAN) 200 MG tablet Take 200 mg by mouth daily.   glucose blood (ACCU-CHEK AVIVA PLUS) test strip 1 each by Other route.   insulin  glargine, 2 Unit Dial, (TOUJEO  MAX SOLOSTAR) 300 UNIT/ML Solostar Pen Inject 10 Units into the skin at bedtime.   losartan -hydrochlorothiazide  (HYZAAR) 100-25 MG per tablet Take 1 tablet by mouth daily.   metFORMIN  (GLUCOPHAGE ) 1000 MG tablet Take 1,000 mg by mouth at bedtime.    OZEMPIC , 0.25 OR 0.5 MG/DOSE, 2 MG/3ML SOPN Inject 0.5 mg into the skin once a week.   pioglitazone  (ACTOS ) 15 MG tablet Take 15 mg by mouth every other day.    Potassium Chloride  Crys ER (KLOR-CON  M20 PO) Take 1  tablet by mouth daily at 12 noon.   rosuvastatin  (CRESTOR ) 10 MG tablet Take 10 mg by mouth daily.   VEOZAH 45 MG TABS Take 1 tablet by mouth daily.     Allergies:   Patient has no known allergies.   Social History   Socioeconomic History   Marital status: Widowed    Spouse name: Not on file   Number of children: 1   Years of education: Not on file   Highest education level: Not on file  Occupational History   Not on file  Tobacco Use   Smoking status: Never   Smokeless tobacco: Never  Vaping Use   Vaping status: Never Used  Substance and Sexual Activity   Alcohol use: Not Currently    Comment: social    Drug use: No   Sexual activity: Not on file  Other Topics Concern   Not on file  Social History Narrative   Not on file   Social Drivers of Health   Financial Resource Strain: Low Risk  (01/14/2024)   Received from [facility]   Overall Financial Resource Strain (CARDIA)    Difficulty of Paying Living Expenses: Not hard at all  Food Insecurity: No Food Insecurity (01/14/2024)   Received from [facility]   Hunger Vital Sign    Within the past 12 months, you worried that your food would run out before you got the money to buy more.: Never true    Within the past 12 months, the food you bought just didn't last and you didn't have money to get more.: Never true  Transportation Needs: No Transportation Needs (01/14/2024)   Received from [facility] - Transportation    Lack of Transportation (Medical): No    Lack of Transportation (Non-Medical): No  Physical Activity: Not on file  Stress: Not on file  Social Connections: Unknown (12/06/2021)   Received from [facility]   Social Network    Social Network: Not on file     Family History: The patient's family history includes Diabetes in her mother; Heart disease in her brother; Hypertension in her mother; Pancreatic cancer in her maternal grandfather. There is no history of Colon cancer, Esophageal  cancer, Liver cancer, Rectal cancer, or Stomach cancer. ROS:   Please see the history of present illness.    All 14 point review of systems negative except as described per history of present illness.  EKGs/Labs/Other Studies Reviewed:    The following studies were reviewed today:   EKG:     EKG show normal sinus rhythm possible left atrial enlargement left axis deviation cannot rule out anteroseptal wall MI.  Recent Labs: No results found for requested labs within last 365 days.  Recent Lipid Panel No results found for: CHOL, TRIG,  HDL, CHOLHDL, VLDL, LDLCALC, LDLDIRECT  Physical Exam:    VS:  BP 118/72   Pulse 79   Ht 5' 4 (1.626 m)   Wt 135 lb 12.8 oz (61.6 kg)   SpO2 98%   BMI 23.31 kg/m     Wt Readings from Last 3 Encounters:  04/14/24 135 lb 12.8 oz (61.6 kg)  03/22/23 132 lb (59.9 kg)  01/01/22 152 lb (68.9 kg)     GEN:  Well nourished, well developed in no acute distress HEENT: Normal NECK: No JVD; No carotid bruits LYMPHATICS: No lymphadenopathy CARDIAC: RRR, no murmurs, no rubs, no gallops RESPIRATORY:  Clear to auscultation without rales, wheezing or rhonchi  ABDOMEN: Soft, non-tender, non-distended MUSCULOSKELETAL:  No edema; No deformity  SKIN: Warm and dry NEUROLOGIC:  Alert and oriented x 3 PSYCHIATRIC:  Normal affect   ASSESSMENT:    1. Hyperlipidemia, unspecified hyperlipidemia type   2. Atypical chest pain   3. Dyspnea on exertion   4. Primary hypertension   5. Diabetes mellitus without complication (HCC)    PLAN:    In order of problems listed above:  Atypical chest pain: We had a long discussion about what to do with the situation I think she can be best served by having coronary CT angio.  I will schedule her to have the test done.  Also ask her to start taking 1 baby aspirin  every single day. Dyspnea exertion, will do echocardiogram to assess left ventricle ejection fraction.  If cardiac workup is negative then we will  consider doing pulmonary workup which probably will include CT of her chest as well as pulmonary function test.  However the fact that she is diabetic with longstanding diabetes previously poorly controlled I think it will be essential to rule out significant coronary disease and coronary CT angio can cerebral dysfunction. Essential hypertension: Blood pressure well-controlled continue present management. Dyslipidemia I did review K PN which show me her LDL of 54 HDL 81 is a good cholesterol profile    Medication Adjustments/Labs and Tests Ordered: Current medicines are reviewed at length with the patient today.  Concerns regarding medicines are outlined above.  Orders Placed This Encounter  Procedures   EKG 12-Lead   No orders of the defined types were placed in this encounter.   Signed, Lamar DOROTHA Fitch, MD, [facility]. 04/14/2024 1:27 PM    [facility]

## 2024-04-14 NOTE — Patient Instructions (Addendum)
 Medication Instructions:   TAKE: Metoprolol  100mg  1 tablet 2 hours prior to CT scan  START: Aspirin  81mg  1 tablet daily   Lab Work: [employer]- today If you have labs (blood work) drawn today and your tests are completely normal, you will receive your results only by: MyChart Message (if you have MyChart) OR A paper copy in the mail If you have any lab test that is abnormal or we need to change your treatment, we will call you to review the results.   Testing/Procedures:  Your physician has requested that you have an echocardiogram. Echocardiography is a painless test that uses sound waves to create images of your heart. It provides your doctor with information about the size and shape of your heart and how well your heart's chambers and valves are working. This procedure takes approximately one hour. There are no restrictions for this procedure. Please do NOT wear cologne, perfume, aftershave, or lotions (deodorant is allowed). Please arrive 15 minutes prior to your appointment time.  Please note: We ask at that you not bring children with you during ultrasound (echo/ vascular) testing. Due to room size and safety concerns, children are not allowed in the ultrasound rooms during exams. Our front office staff cannot provide observation of children in our lobby area while testing is being conducted. An adult accompanying a patient to their appointment will only be allowed in the ultrasound room at the discretion of the ultrasound technician under special circumstances. We apologize for any inconvenience.    Your cardiac CT will be scheduled at one of the below locations:    [facility] [address]  [place], [place] [address]    If scheduled at the Heart and Vascular Tower at [address], please enter the parking lot using the [address] entrance and use the FREE valet service at the patient drop-off area. Enter the buidling and check-in with  registration on the main floor.    Please follow these instructions carefully (unless otherwise directed):  An IV will be required for this test and Nitroglycerin will be given.     On the Night Before the Test: Be sure to Drink plenty of water. Do not consume any caffeinated/decaffeinated beverages or chocolate 12 hours prior to your test. Do not take any antihistamines 12 hours prior to your test.  On the Day of the Test: Drink plenty of water until 1 hour prior to the test. Do not eat any food 1 hour prior to test. You may take your regular medications prior to the test.  Take metoprolol  (Lopressor ) two hours prior to test. If you take Furosemide/Hydrochlorothiazide /Spironolactone/Chlorthalidone, please HOLD on the morning of the test. Patients who wear a continuous glucose monitor MUST remove the device prior to scanning. FEMALES- please wear underwire-free bra if available, avoid dresses & tight clothing   After the Test: Drink plenty of water. After receiving IV contrast, you may experience a mild flushed feeling. This is normal. On occasion, you may experience a mild rash up to 24 hours after the test. This is not dangerous. If this occurs, you can take Benadryl 25 mg, Zyrtec , Claritin, or Allegra and increase your fluid intake. (Patients taking Tikosyn should avoid Benadryl, and may take Zyrtec , Claritin, or Allegra) If you experience trouble breathing, this can be serious. If it is severe call 911 IMMEDIATELY. If it is mild, please call our office.  We will call to schedule your test 2-4 weeks out understanding that some insurance companies  will need an authorization prior to the service being performed.   For more information and frequently asked questions, please visit our website : [email or web address]  For non-scheduling related questions, please contact the cardiac imaging nurse navigator should you have any questions/concerns: Cardiac Imaging Nurse  Navigators Direct Office Dial: [phone]   For scheduling needs, including cancellations and rescheduling, please call [place], [phone].    Follow-Up: At [facility], you and your health needs are our priority.  As part of our continuing mission to provide you with exceptional heart care, we have created designated Provider Care Teams.  These Care Teams include your primary Cardiologist (physician) and Advanced Practice Providers (APPs -  Physician Assistants and Nurse Practitioners) who all work together to provide you with the care you need, when you need it.  We recommend signing up for the patient portal called MyChart.  Sign up information is provided on this After Visit Summary.  MyChart is used to connect with patients for Virtual Visits (Telemedicine).  Patients are able to view lab/test results, encounter notes, upcoming appointments, etc.  Non-urgent messages can be sent to your provider as well.   To learn more about what you can do with MyChart, go to [email or web address].    Your next appointment:   2 month(s)  The format for your next appointment:   In Person  Provider:   Lamar Fitch, MD    Other Instructions NA

## 2024-04-15 LAB — BASIC METABOLIC PANEL WITH GFR
BUN/Creatinine Ratio: 19 (ref 12–28)
BUN: 19 mg/dL (ref 8–27)
CO2: 22 mmol/L (ref 20–29)
Calcium: 9.6 mg/dL (ref 8.7–10.3)
Chloride: 102 mmol/L (ref 96–106)
Creatinine, Ser: 1.02 mg/dL — ABNORMAL HIGH (ref 0.57–1.00)
Glucose: 92 mg/dL (ref 70–99)
Potassium: 4.1 mmol/L (ref 3.5–5.2)
Sodium: 140 mmol/L (ref 134–144)
eGFR: 57 mL/min/1.73 — ABNORMAL LOW (ref >59)

## 2024-04-19 ENCOUNTER — Telehealth: Payer: Self-pay | Admitting: Cardiology

## 2024-04-19 NOTE — Telephone Encounter (Signed)
 Patient calling for results of her lab work on 09/17, please advise.

## 2024-04-22 ENCOUNTER — Encounter (HOSPITAL_COMMUNITY): Payer: Self-pay

## 2024-04-26 ENCOUNTER — Ambulatory Visit (HOSPITAL_COMMUNITY)
Admission: RE | Admit: 2024-04-26 | Discharge: 2024-04-26 | Disposition: A | Discharge status: Home / Self Care | Source: Ambulatory Visit | Attending: Cardiology | Admitting: Cardiology

## 2024-04-26 DIAGNOSIS — R072 Precordial pain: Secondary | ICD-10-CM | POA: Insufficient documentation

## 2024-04-26 DIAGNOSIS — E1169 Type 2 diabetes mellitus with other specified complication: Secondary | ICD-10-CM | POA: Diagnosis not present

## 2024-04-26 DIAGNOSIS — I3481 Nonrheumatic mitral (valve) annulus calcification: Secondary | ICD-10-CM | POA: Diagnosis not present

## 2024-04-26 DIAGNOSIS — Z0000 Encounter for general adult medical examination without abnormal findings: Secondary | ICD-10-CM | POA: Diagnosis not present

## 2024-04-26 MED ORDER — NITROGLYCERIN 0.4 MG SL SUBL
0.8000 mg | SUBLINGUAL_TABLET | Freq: Once | SUBLINGUAL | Status: AC
  Administered 2024-04-26: 0.8 mg via SUBLINGUAL

## 2024-04-26 MED ORDER — IOHEXOL 350 MG/ML SOLN
100.0000 mL | Freq: Once | INTRAVENOUS | Status: AC | PRN
Start: 2024-04-26 — End: 2024-04-26
  Administered 2024-04-26: 100 mL via INTRAVENOUS

## 2024-04-30 ENCOUNTER — Ambulatory Visit: Payer: Self-pay | Admitting: Cardiology

## 2024-05-03 ENCOUNTER — Telehealth: Payer: Self-pay | Admitting: Cardiology

## 2024-05-03 NOTE — Telephone Encounter (Signed)
 Error

## 2024-05-03 NOTE — Telephone Encounter (Signed)
 Pt would like a c/b regarding Test Results.Please Advise

## 2024-05-03 NOTE — Telephone Encounter (Signed)
 Patient informed of the results of her cardiac CT. Patient had no further questions at this time.

## 2024-05-12 ENCOUNTER — Ambulatory Visit (HOSPITAL_COMMUNITY)
Admission: RE | Admit: 2024-05-12 | Discharge: 2024-05-12 | Disposition: A | Discharge status: Home / Self Care | Source: Ambulatory Visit | Attending: Cardiovascular Disease | Admitting: Cardiovascular Disease

## 2024-05-12 DIAGNOSIS — R0609 Other forms of dyspnea: Secondary | ICD-10-CM | POA: Insufficient documentation

## 2024-05-12 LAB — ECHOCARDIOGRAM COMPLETE
AR max vel: 1.38 cm2
AV Area VTI: 1.32 cm2
AV Area mean vel: 1.34 cm2
AV Mean grad: 6 mmHg
AV Peak grad: 11.6 mmHg
Ao pk vel: 1.7 m/s
Area-P 1/2: 2.19 cm2
Est EF: >75
MV M vel: 2.48 m/s
MV Peak grad: 24.6 mmHg
MV VTI: 1.33 cm2
S' Lateral: 2 cm

## 2024-05-17 ENCOUNTER — Other Ambulatory Visit (HOSPITAL_BASED_OUTPATIENT_CLINIC_OR_DEPARTMENT_OTHER): Payer: Self-pay

## 2024-05-24 ENCOUNTER — Telehealth: Payer: Self-pay

## 2024-05-24 NOTE — Telephone Encounter (Signed)
 LVM and My Chart Message per DPR- per Dr. Karry note regarding Echo results. Encouraged to call with any questions or concerns.

## 2024-06-07 DIAGNOSIS — K146 Glossodynia: Secondary | ICD-10-CM | POA: Diagnosis not present

## 2024-06-07 DIAGNOSIS — B370 Candidal stomatitis: Secondary | ICD-10-CM | POA: Diagnosis not present

## 2024-06-07 DIAGNOSIS — K117 Disturbances of salivary secretion: Secondary | ICD-10-CM | POA: Diagnosis not present

## 2024-06-16 ENCOUNTER — Encounter: Payer: Self-pay | Admitting: Cardiology

## 2024-06-16 ENCOUNTER — Ambulatory Visit: Attending: Cardiology | Admitting: Cardiology

## 2024-06-16 VITALS — BP 100/60 | HR 87 | Ht 64.0 in | Wt 138.0 lb

## 2024-06-16 DIAGNOSIS — R0789 Other chest pain: Secondary | ICD-10-CM

## 2024-06-16 DIAGNOSIS — E782 Mixed hyperlipidemia: Secondary | ICD-10-CM

## 2024-06-16 DIAGNOSIS — E119 Type 2 diabetes mellitus without complications: Secondary | ICD-10-CM

## 2024-06-16 DIAGNOSIS — I10 Essential (primary) hypertension: Secondary | ICD-10-CM | POA: Diagnosis not present

## 2024-06-16 NOTE — Patient Instructions (Signed)
 Medication Instructions:  Your physician recommends that you continue on your current medications as directed. Please refer to the Current Medication list given to you today.  *If you need a refill on your cardiac medications before your next appointment, please call your pharmacy*  Lab Work: None If you have labs (blood work) drawn today and your tests are completely normal, you will receive your results only by: MyChart Message (if you have MyChart) OR A paper copy in the mail If you have any lab test that is abnormal or we need to change your treatment, we will call you to review the results.  Testing/Procedures: None  Follow-Up: At [facility], you and your health needs are our priority.  As part of our continuing mission to provide you with exceptional heart care, our providers are all part of one team.  This team includes your primary Cardiologist (physician) and Advanced Practice Providers or APPs (Physician Assistants and Nurse Practitioners) who all work together to provide you with the care you need, when you need it.  Your next appointment:   1 year(s)  Provider:   Ralene Burger, MD    We recommend signing up for the patient portal called "MyChart".  Sign up information is provided on this After Visit Summary.  MyChart is used to connect with patients for Virtual Visits (Telemedicine).  Patients are able to view lab/test results, encounter notes, upcoming appointments, etc.  Non-urgent messages can be sent to your provider as well.   To learn more about what you can do with MyChart, go to [email or web address].   Other Instructions None

## 2024-06-16 NOTE — Progress Notes (Unsigned)
 Cardiology Office Note:    Date:  06/16/2024   ID:  Mary Choi, DOB 10-20-1950, MRN [id number]  PCP:  Benjamine Aland, MD  Cardiologist:  Lamar Fitch, MD    Referring MD: Benjamine Aland, MD   Chief Complaint  Patient presents with   Follow-up  Doing fine  History of Present Illness:    Mary Choi is a 73 y.o. female past medical history significant for essential hypertension, dyslipidemia, diabetes she was referred to us  because of dyspnea on exertion suspicion was that she may have angina equivalent coronary CT angio was done which surprisingly showed absolutely normal coronaries with calcium  score of 0 interestingly she got severe mitral calcification but otherwise things are looking good.  She comes here to talk about results of the test she is doing fine she admits that she is living very sedentary lifestyle and she understand that this is a problem.  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   Hyperlipidemia    Hypertension    Oral candida 07/21/2023   Seborrheic keratosis 07/17/2022    Past Surgical History:  Procedure Laterality Date   ABDOMINAL HYSTERECTOMY     BREAST SURGERY     CESAREAN SECTION     MASTECTOMY Right    ORIF ANKLE FRACTURE Right 01/31/2015   Procedure: OPEN REDUCTION INTERNAL FIXATION (ORIF) ANKLE FRACTURE;  Surgeon: Evalene JONETTA Chancy, MD;  Location: [facility];  Service: Orthopedics;  Laterality: Right;    Current Medications: Current Meds  Medication Sig   acetaminophen  (TYLENOL ) 500 MG tablet Take 1,000 mg by mouth every 6 (six) hours as  needed for mild pain.   albuterol  (PROVENTIL  HFA;VENTOLIN  HFA) 108 (90 BASE) MCG/ACT inhaler Inhale 2 puffs into the lungs every 6 (six) hours as needed for wheezing.   aspirin  EC 81 MG tablet Take 1 tablet (81 mg total) by mouth daily. Swallow whole.   baclofen (LIORESAL) 10 MG tablet Take 10 mg by mouth 4 (four) times daily.   busPIRone (BUSPAR) 10 MG tablet Take 10 mg by mouth 2 (two) times daily.   cholecalciferol (VITAMIN D3) 25 MCG (1000 UNIT) tablet Take 1,000 Units by mouth daily.   Continuous Glucose Sensor (FREESTYLE LIBRE 3 SENSOR) MISC Apply topically every 3 (three) days.   DILT-XR 240 MG 24 hr capsule Take 240 mg by mouth daily.   EMBECTA PEN NEEDLE NANO 2 GEN 32G X 4 MM MISC as directed.   FARXIGA 10 MG TABS tablet Take by mouth.   fluconazole (DIFLUCAN) 200 MG tablet Take 200 mg by mouth daily.   glucose blood (ACCU-CHEK AVIVA PLUS) test strip 1 each by Other route.   insulin  glargine, 2 Unit Dial, (TOUJEO  MAX SOLOSTAR) 300 UNIT/ML Solostar Pen Inject 10 Units into the skin at bedtime.   losartan -hydrochlorothiazide  (HYZAAR) 100-25 MG per tablet Take 1 tablet by mouth daily.   metFORMIN  (GLUCOPHAGE ) 1000 MG tablet Take 1,000 mg by mouth at bedtime.    OZEMPIC , 0.25 OR 0.5 MG/DOSE, 2 MG/3ML SOPN Inject 0.5 mg into the  skin once a week.   pioglitazone  (ACTOS ) 15 MG tablet Take 15 mg by mouth every other day.    Potassium Chloride  Crys ER (KLOR-CON  M20 PO) Take 1 tablet by mouth daily at 12 noon.   rosuvastatin  (CRESTOR ) 10 MG tablet Take 10 mg by mouth daily.   VEOZAH 45 MG TABS Take 1 tablet by mouth daily.     Allergies:   Patient has no known allergies.   Social History   Socioeconomic History   Marital status: Widowed    Spouse name: Not on file   Number of children: 1   Years of education: Not on file   Highest education level: Not on file  Occupational History   Not on file  Tobacco Use   Smoking status: Never   Smokeless tobacco: Never  Vaping Use   Vaping  status: Never Used  Substance and Sexual Activity   Alcohol use: Not Currently    Comment: social    Drug use: No   Sexual activity: Not on file  Other Topics Concern   Not on file  Social History Narrative   Not on file   Social Drivers of Health   Financial Resource Strain: Low Risk  (01/14/2024)   Received from [facility]   Overall Financial Resource Strain (CARDIA)    Difficulty of Paying Living Expenses: Not hard at all  Food Insecurity: No Food Insecurity (01/14/2024)   Received from [facility]   Hunger Vital Sign    Within the past 12 months, you worried that your food would run out before you got the money to buy more.: Never true    Within the past 12 months, the food you bought just didn't last and you didn't have money to get more.: Never true  Transportation Needs: No Transportation Needs (01/14/2024)   Received from [facility] - Transportation    Lack of Transportation (Medical): No    Lack of Transportation (Non-Medical): No  Physical Activity: Not on file  Stress: Not on file  Social Connections: Not on file     Family History: The patient's family history includes Diabetes in her mother; Heart disease in her brother; Hypertension in her mother; Pancreatic cancer in her maternal grandfather. There is no history of Colon cancer, Esophageal cancer, Liver cancer, Rectal cancer, or Stomach cancer. ROS:   Please see the history of present illness.    All 14 point review of systems negative except as described per history of present illness  EKGs/Labs/Other Studies Reviewed:         Recent Labs: 04/14/2024: BUN 19; Creatinine, Ser 1.02; Potassium 4.1; Sodium 140  Recent Lipid Panel No results found for: CHOL, TRIG, HDL, CHOLHDL, VLDL, LDLCALC, LDLDIRECT  Physical Exam:    VS:  BP 100/60   Pulse 87   Ht 5' 4 (1.626 m)   Wt 138 lb (62.6 kg)   SpO2 98%   BMI 23.69 kg/m     Wt Readings from Last 3 Encounters:  06/16/24 138  lb (62.6 kg)  04/14/24 135 lb 12.8 oz (61.6 kg)  03/22/23 132 lb (59.9 kg)     GEN:  Well nourished, well developed in no acute distress HEENT: Normal NECK: No JVD; No carotid bruits LYMPHATICS: No lymphadenopathy CARDIAC: RRR, no murmurs, no rubs, no gallops RESPIRATORY:  Clear to auscultation without rales, wheezing or rhonchi  ABDOMEN: Soft, non-tender, non-distended MUSCULOSKELETAL:  No edema; No deformity  SKIN: Warm and dry LOWER EXTREMITIES: no swelling  NEUROLOGIC:  Alert and oriented x 3 PSYCHIATRIC:  Normal affect   ASSESSMENT:    1. Primary hypertension   2. Diabetes mellitus without complication (HCC)   3. Atypical chest pain   4. Mixed hyperlipidemia    PLAN:    In order of problems listed above:  Essential hypertension blood pressure well-controlled continue present management. Diabetes followed by antimedicine team last hemoglobin A1c is 8.3 from 03/19/2024 she understand need to she need to do better and hopefully with exercise and she will be able to accomplish that goal. Atypical chest pain, coronary CT angio showed normal coronaries with calcium  score 0 I congratulated her for it I encouraged her to be active and keep with good job. Mixed dyslipidemia I did review [facility] which show me her LDL of 54 HDL 81 she is taking Crestor  10 which I advised to continue. Dyspnea on exertion I suspect the reason for that is deconditioning I encouraged her to exercise on a regular basis.  We did review echocardiogram which showed normal ejection fraction and severe mitral calcification but no obstruction of flow   Medication Adjustments/Labs and Tests Ordered: Current medicines are reviewed at length with the patient today.  Concerns regarding medicines are outlined above.  No orders of the defined types were placed in this encounter.  Medication changes: No orders of the defined types were placed in this encounter.   Signed, Lamar DOROTHA Fitch, MD, [facility] 06/16/2024 1:11  PM    [facility]

## 2024-06-23 DIAGNOSIS — E785 Hyperlipidemia, unspecified: Secondary | ICD-10-CM | POA: Diagnosis not present

## 2024-06-23 DIAGNOSIS — I10 Essential (primary) hypertension: Secondary | ICD-10-CM | POA: Diagnosis not present

## 2024-06-28 DIAGNOSIS — I890 Lymphedema, not elsewhere classified: Secondary | ICD-10-CM | POA: Diagnosis not present

## 2024-07-02 ENCOUNTER — Other Ambulatory Visit (HOSPITAL_BASED_OUTPATIENT_CLINIC_OR_DEPARTMENT_OTHER): Payer: Self-pay

## 2024-07-03 ENCOUNTER — Other Ambulatory Visit (HOSPITAL_BASED_OUTPATIENT_CLINIC_OR_DEPARTMENT_OTHER): Payer: Self-pay

## 2024-07-03 MED ORDER — OZEMPIC (0.25 OR 0.5 MG/DOSE) 2 MG/3ML ~~LOC~~ SOPN
0.5000 mg | PEN_INJECTOR | SUBCUTANEOUS | 1 refills | Status: AC
  Filled 2024-07-03: qty 3, 28d supply, fill #0
  Filled 2024-08-29: qty 3, 28d supply, fill #1

## 2024-07-09 ENCOUNTER — Other Ambulatory Visit (HOSPITAL_BASED_OUTPATIENT_CLINIC_OR_DEPARTMENT_OTHER): Payer: Self-pay

## 2024-07-23 ENCOUNTER — Other Ambulatory Visit: Payer: Self-pay | Admitting: Obstetrics & Gynecology

## 2024-07-23 DIAGNOSIS — Z1231 Encounter for screening mammogram for malignant neoplasm of breast: Secondary | ICD-10-CM

## 2024-07-28 ENCOUNTER — Ambulatory Visit

## 2024-08-11 ENCOUNTER — Ambulatory Visit
Admission: RE | Admit: 2024-08-11 | Discharge: 2024-08-11 | Disposition: A | Discharge status: Home / Self Care | Source: Ambulatory Visit | Attending: Obstetrics & Gynecology | Admitting: Obstetrics & Gynecology

## 2024-08-11 ENCOUNTER — Other Ambulatory Visit: Payer: Self-pay | Admitting: Obstetrics & Gynecology

## 2024-08-11 DIAGNOSIS — Z1231 Encounter for screening mammogram for malignant neoplasm of breast: Secondary | ICD-10-CM

## 2024-08-11 DIAGNOSIS — N644 Mastodynia: Secondary | ICD-10-CM

## 2024-08-20 ENCOUNTER — Ambulatory Visit
Admission: RE | Admit: 2024-08-20 | Discharge: 2024-08-20 | Disposition: A | Source: Ambulatory Visit | Attending: Obstetrics & Gynecology | Admitting: Obstetrics & Gynecology

## 2024-08-20 ENCOUNTER — Other Ambulatory Visit: Payer: Self-pay | Admitting: Obstetrics & Gynecology

## 2024-08-20 ENCOUNTER — Ambulatory Visit: Admission: RE | Admit: 2024-08-20 | Source: Ambulatory Visit

## 2024-08-20 DIAGNOSIS — N644 Mastodynia: Secondary | ICD-10-CM

## 2024-08-20 DIAGNOSIS — N6320 Unspecified lump in the left breast, unspecified quadrant: Secondary | ICD-10-CM

## 2024-08-25 ENCOUNTER — Ambulatory Visit

## 2024-08-30 ENCOUNTER — Ambulatory Visit

## 2024-09-06 ENCOUNTER — Ambulatory Visit
